# Patient Record
Sex: Male | Born: 1945 | Race: White | Hispanic: No | Marital: Married | Smoking: Never smoker
Health system: Southern US, Community
[De-identification: ages and names within clinical notes are randomized; demographics above are authoritative.]

## PROBLEM LIST (undated history)

## (undated) DIAGNOSIS — IMO0002 Reserved for concepts with insufficient information to code with codable children: Secondary | ICD-10-CM

## (undated) DIAGNOSIS — N189 Chronic kidney disease, unspecified: Secondary | ICD-10-CM

## (undated) DIAGNOSIS — IMO0001 Reserved for inherently not codable concepts without codable children: Secondary | ICD-10-CM

## (undated) DIAGNOSIS — C801 Malignant (primary) neoplasm, unspecified: Secondary | ICD-10-CM

## (undated) DIAGNOSIS — I10 Essential (primary) hypertension: Secondary | ICD-10-CM

## (undated) HISTORY — PX: HERNIA REPAIR: SHX51

## (undated) HISTORY — PX: OTHER SURGICAL HISTORY: SHX169

---

## 1999-12-23 ENCOUNTER — Ambulatory Visit (HOSPITAL_COMMUNITY): Admission: RE | Admit: 1999-12-23 | Discharge: 1999-12-23 | Payer: Self-pay | Admitting: Family Medicine

## 1999-12-23 ENCOUNTER — Encounter: Payer: Self-pay | Admitting: Family Medicine

## 2003-07-16 ENCOUNTER — Inpatient Hospital Stay (HOSPITAL_COMMUNITY): Admission: EM | Admit: 2003-07-16 | Discharge: 2003-07-19 | Payer: Self-pay | Admitting: Emergency Medicine

## 2003-07-16 ENCOUNTER — Encounter: Payer: Self-pay | Admitting: *Deleted

## 2005-07-23 ENCOUNTER — Encounter: Admission: RE | Admit: 2005-07-23 | Discharge: 2005-07-23 | Payer: Self-pay | Admitting: Family Medicine

## 2005-10-30 ENCOUNTER — Emergency Department (HOSPITAL_COMMUNITY): Admission: EM | Admit: 2005-10-30 | Discharge: 2005-10-30 | Payer: Self-pay | Admitting: Emergency Medicine

## 2007-05-11 ENCOUNTER — Ambulatory Visit: Admission: RE | Admit: 2007-05-11 | Discharge: 2007-09-05 | Payer: Self-pay | Admitting: Radiation Oncology

## 2007-08-06 ENCOUNTER — Ambulatory Visit: Admission: RE | Admit: 2007-08-06 | Discharge: 2007-09-05 | Payer: Self-pay | Admitting: Radiation Oncology

## 2008-04-07 ENCOUNTER — Emergency Department (HOSPITAL_COMMUNITY): Admission: EM | Admit: 2008-04-07 | Discharge: 2008-04-07 | Payer: Self-pay | Admitting: Emergency Medicine

## 2008-06-06 ENCOUNTER — Encounter: Payer: Self-pay | Admitting: Urology

## 2008-06-06 ENCOUNTER — Inpatient Hospital Stay (HOSPITAL_COMMUNITY): Admission: RE | Admit: 2008-06-06 | Discharge: 2008-06-08 | Payer: Self-pay | Admitting: Urology

## 2008-09-18 ENCOUNTER — Encounter: Admission: RE | Admit: 2008-09-18 | Discharge: 2008-10-09 | Payer: Self-pay | Admitting: Family Medicine

## 2009-04-27 ENCOUNTER — Ambulatory Visit (HOSPITAL_COMMUNITY): Admission: RE | Admit: 2009-04-27 | Discharge: 2009-04-28 | Payer: Self-pay | Admitting: General Surgery

## 2010-12-10 ENCOUNTER — Encounter
Admission: RE | Admit: 2010-12-10 | Discharge: 2010-12-10 | Payer: Self-pay | Source: Home / Self Care | Attending: Urology | Admitting: Urology

## 2011-03-18 LAB — LIPID PANEL
Cholesterol: 186 mg/dL (ref 0–200)
HDL: 58 mg/dL (ref >39)
Total CHOL/HDL Ratio: 3.2 RATIO
VLDL: 11 mg/dL (ref 0–40)

## 2011-03-18 LAB — CBC
HCT: 46 % (ref 39.0–52.0)
Hemoglobin: 15.7 g/dL (ref 13.0–17.0)
MCHC: 34.2 g/dL (ref 30.0–36.0)
RDW: 13.7 % (ref 11.5–15.5)

## 2011-03-18 LAB — TYPE AND SCREEN: ABO/RH(D): A NEG

## 2011-03-18 LAB — DIFFERENTIAL
Basophils Relative: 1 % (ref 0–1)
Monocytes Relative: 10 % (ref 3–12)
Neutro Abs: 3.7 10*3/uL (ref 1.7–7.7)
Neutrophils Relative %: 70 % (ref 43–77)

## 2011-03-18 LAB — COMPREHENSIVE METABOLIC PANEL
Alkaline Phosphatase: 50 U/L (ref 39–117)
BUN: 12 mg/dL (ref 6–23)
CO2: 28 mEq/L (ref 19–32)
Calcium: 9.7 mg/dL (ref 8.4–10.5)
GFR calc non Af Amer: >60 mL/min (ref >60)
Glucose, Bld: 105 mg/dL — ABNORMAL HIGH (ref 70–99)
Potassium: 5.1 mEq/L (ref 3.5–5.1)
Total Protein: 6.9 g/dL (ref 6.0–8.3)

## 2011-03-18 LAB — PSA: PSA: 1.01 ng/mL (ref 0.10–4.00)

## 2011-03-18 LAB — PROTIME-INR
INR: 1 (ref 0.00–1.49)
Prothrombin Time: 13.3 seconds (ref 11.6–15.2)

## 2011-04-22 NOTE — Op Note (Signed)
Sergio Ramos, DAY NO.:  [id number]   MEDICAL RECORD NO.:  [id number]          PATIENT TYPE:  INP   LOCATION:  0004                         FACILITY:  [facility]   PHYSICIAN:  Bertram Millard. Dahlstedt, M.D.DATE OF BIRTH:  [birth year]   DATE OF PROCEDURE:  06/06/2008  DATE OF DISCHARGE:                               OPERATIVE REPORT   PREOPERATIVE DIAGNOSES:  Renal tumor, status post partial nephrectomy,  with postoperative bleed.   POSTOPERATIVE DIAGNOSES:  Renal tumor, status post partial nephrectomy,  with postoperative bleed.   PRINCIPAL PROCEDURE:  Laparoscopic reexploration of postoperative bleed,  placement of hemostatic agents.   SURGEON:  Bertram Millard. Dahlstedt, M.D.   FIRST ASSISTANT:  Valetta Fuller, M.D.   ANESTHESIA:  General endotracheal.   COMPLICATIONS:  None.   DRAINS:  A 15 French Blake drain in right upper quadrant.   BRIEF HISTORY:  Sergio Ramos underwent a laparoscopic partial nephrectomy  several hours ago.  He had an uncomplicated procedure, and at the  termination of the procedure there was no bleeding of the tumor site.  Once he was awakened, and in the PACU, he developed a slow bleed, so  much that he eventually had 450 mL of blood in his drain.  He was  hemodynamically stable.  His preoperative hematocrit was approximately  45%.  Due to the steady nature of this bleed, despite him being stable,  I felt that it was most appropriate to proceed with exploration and  treatment of this bleed.  I discussed this situation with the patient  and his wife.  They agree with proceeding.   DESCRIPTION OF PROCEDURE:  The patient was administered general  anesthetic after proper identification was performed.  He was placed in  the right side up position on the table, with the table flexed.  All  extremities were padded appropriately and an axillary roll was placed.  His entire abdomen and right flank was prepped.  The drain was left in.  He was  prepped and draped.  His incisions were opened up, except for the  right upper quadrant incision where the drain was present.  A 12-mm  trocar was placed in the midline in the right lower quadrant through  previously placed incisions.  A 5-mm trocar was placed in the midline  upper abdomen.  Pneumoperitoneum was then established.  Inspection of  the right upper quadrant revealed a fair amount of clot above the liver  and above the kidney anteriorly.  There was also some medially and  lateral to the kidney.  Once the clot was removed, there did not seem to  be active bleeding over the partial nephrectomy site.  This was  inspected significantly under pneumoperitoneum and with the  pneumoperitoneum released.  Inspection was then carried out medially.  The hilar area seemed to be hemostatic.  There was an old clot there  that was removed.  Laterally and inferiorly, as well as posteriorly, no  bleeding was seen.  The peritoneal edge of the upper pole of the kidney  was identified.  There seemed to be some oozing coming from  this area.  No specific site was seen, however.  FloSeal was placed in this area.  It was felt that, after this was placed, most of the bleeding was  stopped.  Inspection of this area and of the right pericolic gutter was  performed.  After this, no further blood was seen pooling in this area.  The FloSeal was also placed in the lateral aspect of the dissected  kidney.  Surgicel was placed over top of this.  Inspection was carried  out for approximately 15 minutes after hemostatic agents were placed.  No significant bleeding was seen with either the pneumoperitoneum up or  with it released.  The trocar sites were all inspected, and no bleeding  was seen.  At this point, the pneumoperitoneum was released, trocars  were removed, and fascial sutures were placed in the lower abdominal  incisions using 0 Vicryl placed in simple interrupted fashion.  The skin  edges were  reapproximated using staples.  Dry sterile dressings were  placed.  Inspection of the drain after this revealed no significant  oozing.  The patient was awakened, extubated, and taken to the PACU in  stable condition.      Bertram Millard. Dahlstedt, M.D.  Electronically Signed     SMD/MEDQ  D:  06/06/2008  T:  06/06/2008  Job:  [id number]

## 2011-04-22 NOTE — Op Note (Signed)
NAMEEDUAR, KUMPF NO.:  [id number]   MEDICAL RECORD NO.:  [id number]          PATIENT TYPE:  INP   LOCATION:  0004                         FACILITY:  [facility]   PHYSICIAN:  Bertram Millard. Dahlstedt, M.D.DATE OF BIRTH:  11-25-1946   DATE OF PROCEDURE:  06/06/2008  DATE OF DISCHARGE:                               OPERATIVE REPORT   PREOPERATIVE DIAGNOSIS:  Right renal mass.   POSTOPERATIVE DIAGNOSIS:  Right renal mass.   PROCEDURE PERFORMED:  Right laparoscopic partial nephrectomy and  intraoperative ultrasound.   SURGEON:  Bertram Millard. Dahlstedt, M.D.   ASSISTANT:  Melina Schools, MD   ANESTHESIA:  General   INDICATIONS FOR PROCEDURE:  Mr. Rosenberger is a 65 year old male with a  history of prostate cancer, status post IMRT.  He was found to have a  renal cortical mass measuring 1.3 cm in the right lower pole.  This was  felt amenable to minimally invasive partial nephrectomy.   DESCRIPTION OF PROCEDURE:  The patient brought to the operating room, he  was identified by his armband.  An informed consent was verified and  intraoperative time-out was performed.  Perioperative antibiotics were  administered and sequential compression devices were employed.  The  correct patient, side, and site were all verified; and the specimen time-  out was performed.  After the successful induction of general  anesthesia, the patient was moved to the flank position with the left  side down.  He was prepped and draped in the usual fashion. Surgeons  were gowned and gloved.  A 2-cm infraumbilical incision was made and was  carried down to the aponeurosis of the external oblique which was  incised with cautery.  We then split the rectus.  We then bluntly gained  access to the retroperitoneum.  We placed stay sutures in the fascia.  We then introduced a Hasson port.  We then began insufflation with  carbon dioxide to establish pneumoperitoneum.  After pneumoperitoneum  was  established, we performed laparoscopy.  We noted the colon at the  hepatic flexure, and found the white line of Toldt.  We placed to  assistant 5 mm ports in the epigastrium, and a 12-mm port in the right  lower quadrant.  We then incised the white line of Toldt, and mobilized  the colon medially.  This was done with a combination of blunt and sharp  dissection.  It was mobilized off the anterior surface of the kidney.  We also encountered the duodenum which was kocherized away from the  kidney.   Once this dissection was complete, we continued to mobilize the colon  medially until we could gain access to the retroperitoneum.  We  identified the gonadal vein and the ureter.  We dissected posterior to  that and tented the kidney upwards.  We then began marching up along the  gonadal vessels, and we found where it inserted into the vena cava.  We  then identified with the area where we thought the renal hilum was.  Having identified the hilum, we then proceeded to attempt to identify  the tumor.  There was  a very large amount of fat on Gerota's, but we  were eventually able to mobilize the fat and identified the tumor which  was in the lower pole and somewhat laterally projecting.  Again this was  a very tedious dissection, as she had a large amount of fat.  Once this  was complete, we then skeletonized the vessels at the renal hilum.   The then prepared for the partial nephrectomy.  However, we did not feel  as if we had enough mobility, so we mobilized it laterally, superiorly,  and inferiorly.  We then used the intraoperative ultrasound to confirm  that the tumor was, indeed, in place and it was.  We then began  preparing for the partial nephrectomy.  We had a pre-tied bolster, made  of Surgicel, in place.  We scored the tumor and we gave 50 mg of  mannitol.  We then placed a bulldog across the renal vessels en block.  We then cored out the tumor with a good margin of normal parenchyma.   We  sent a separate deep margin for frozen section, and it returned negative  for tumor.  We then used Argon beam coagulator to fulgurate the bed.  We  placed the bolster within the bed.  We used a single Vicryl stitch to  secure the bolster in place.  This was secured with a combination of Hem-  o-lok clips and laperties.  We then placed FloSeal in the bed.  We then  removed the clamp and our hemostasis was excellent.  We placed a drain  through the lateral port.  We then removed the ports under direct  vision.  We then closed both 12 mm ports under direct vision from the  outside.  At this time, the procedure was terminated.  The patient  tolerated the procedure well.  Warm skin time was 34 minutes.  Bertram Millard. Dahlstedt, M.D. was the attending primary responsible physician and  was present and participated in the procedure.      Melina Schools, MD      Bertram Millard. Dahlstedt, M.D.  Electronically Signed    JR/MEDQ  D:  06/06/2008  T:  06/06/2008  Job:  [id number]

## 2011-04-22 NOTE — Op Note (Signed)
Sergio Ramos, Sergio Ramos NO.:  [id number]   MEDICAL RECORD NO.:  [id number]          PATIENT TYPE:  AMB   LOCATION:  DAY                          FACILITY:  [facility]   PHYSICIAN:  Adolph Pollack, M.D.DATE OF BIRTH:  1946/01/04   DATE OF PROCEDURE:  04/27/2009  DATE OF DISCHARGE:                               OPERATIVE REPORT   PREOPERATIVE DIAGNOSIS:  Ventral incisional hernia.   POSTOPERATIVE DIAGNOSIS:  Ventral incisional hernia.   PROCEDURE:  Laparoscopic ventral incisional hernia repair with mesh  (Parietex with nonadherent barrier).   SURGEON:  Adolph Pollack, M.D.   ANESTHESIA:  General.   INDICATIONS:  This 65 year old male underwent laparoscopic partial  nephrectomy for right renal cell carcinoma and had to have some re-  exploration for postoperative bleeding.  He has had a CT scan as well as  signs and symptoms of a periumbilical incisional hernia and now presents  for repair.  We discussed the procedure risks and aftercare  preoperatively.   TECHNIQUE:  He was seen in the holding area then brought to the  operating room and placed supine on the operating table and a general  anesthetic was administered.  A Foley catheter was inserted into the  bladder.  The abdominal wall hair was clipped and the abdominal wall was  sterilely prepped and draped.  In the left upper quadrant two  fingerbreadths below the costal margin local anesthetic consisting of  Marcaine was infiltrated superficially and deep.  A small incision was  made through the skin and subcutaneous tissue.  Using a 5 mm Optiview  trocar to gained access to the peritoneal cavity and pneumoperitoneum  was created.  Introduction of the laparoscope demonstrated no evidence  of injury to viscera or bleeding.   Following this I could visualize the hernia in the periumbilical region.  No adhesions or bowel was up in the hernia.  I placed a 5 mm trocar in  the left mid abdomen area.  I then  placed two 5 mm trocars on the right  side, one in the right upper quadrant and one in the right mid abdomen.   Using a spinal needle I marked the periphery of the hernia defect in  four quadrants and measured 4 cm away from this and drew a larger oval  on the skin.  I then brought a piece of Parietex mesh with nonadherent  barrier into the field.  The piece of mesh measured approximately 15 x  12 making the defect roughly 8 x 6 cm.   The mesh was cut to appropriate size and four anchoring stitches of #1  Novofil placed in the four quadrants.  Mesh was marked.  The mesh was  then rolled up.  I then replaced the right upper quadrant 5 mm trocar  with an 11 mm trocar.  The mesh was placed through the trocar into the  peritoneal cavity.  The mesh was then unrolled with the nonadherent  barrier side facing the viscera.   I then placed four stab wound incisions four quadrants around the  hernia.  I brought up the  anchoring sutures across the fascial bridge  and then tied these down initially anchoring the mesh to the anterior  abdominal wall with the rough side of the mesh facing the anterior  abdominal wall.  Using spiral tacks I then further anchored the mesh  with an outer layer of tacks and then an inner layer rim of tacks.  This  provided adequate coverage with good overlap of the defect.   Following this I inspected the area and noted no bleeding.  No evidence  of organ injury.  I then released the CO2 gas and then watched the mesh  approximate the omentum.  Trocars were removed.   All skin incisions were closed with 4-0 Monocryl subcuticular stitches.  Steri-Strips and sterile dressings were applied.   He tolerated the procedure well without any apparent complications and  was taken to recovery in satisfactory condition.      Adolph Pollack, M.D.  Electronically Signed     TJR/MEDQ  D:  04/27/2009  T:  04/27/2009  Job:  [id number]   cc:   Valetta Fuller, M.D.  Fax:  [phone]

## 2011-04-25 NOTE — Discharge Summary (Signed)
   NAME:  Sergio Ramos, Sergio Ramos                        ACCOUNT NO.:  [id number]   MEDICAL RECORD NO.:  [id number]                   PATIENT TYPE:  INP   LOCATION:  5031                                 FACILITY:  [facility]   PHYSICIAN:  Lowell Bouton, M.D.      DATE OF BIRTH:  11-04-1946   DATE OF ADMISSION:  07/16/2003  DATE OF DISCHARGE:  07/19/2003                                 DISCHARGE SUMMARY   FINAL DIAGNOSIS:  Flexor tendon sheath infection, right middle finger.   BRIEF HISTORY:  The patient is a 65 year old, right-handed male, who  lacerated his right middle finger on the throttle of [place] nine [place] prior to admission.  He put a Band-Aid over it and three  days prior to admission, it began to swell.  He was seen at [facility],  August 7, and was give Rocephin IV and Keflex p.o.  He did not improve and  presented with increasing pain and swelling.  On exam, he was found to have  tenderness along the flexor sheath with erythema and swelling.  There was  pain on past extension of the digit, and x-rays were negative.   HOSPITAL COURSE:  He was taken to the operating room, August 8, and  underwent incision and drainage of his flexor tendon sheath.  Postoperatively, he was kept on Unasyn IV.  Cultures remained negative  throughout his hospital course.  His wound was packed open and on August 10,  he underwent whirlpool treatment with the removal of his packing.  He  continued to improve and was ready for discharge on August 11.  He was  instructed to soak his finger in salt water three times a day and was given  prescriptions for Tylox and Augmentin.  He was instructed to follow up in  three days in the office.   CONDITION ON DISCHARGE:  Improved.   FINAL DIAGNOSIS:  Flexor tendon sheath infection, right middle finger.                                                Lowell Bouton, M.D.    EMM/MEDQ  D:  09/20/2003  T:  09/20/2003   Job:  [id number]

## 2011-04-25 NOTE — H&P (Signed)
NAME:  Sergio Ramos, Sergio Ramos NO.:  [id number]   MEDICAL RECORD NO.:  [id number]                   PATIENT TYPE:  EMS   LOCATION:  MAJO                                 FACILITY:  [facility]   PHYSICIAN:  Lowell Bouton, M.D.      DATE OF BIRTH:  10-22-1946   DATE OF ADMISSION:  07/16/2003  DATE OF DISCHARGE:                                HISTORY & PHYSICAL   CHIEF COMPLAINT:  Pain and swelling, right middle finger.   HISTORY OF PRESENT ILLNESS:  The patient is a 65 year old, right-handed male  who lacerated his right middle finger on the throttle of [place] 9 [place].  He treated it with a Band-Aid and continued to get  a lot of water in it.  He developed swelling 3 days ago and went to urgent  medical care yesterday.  He was given a gram of Rocephin IV and then placed  on Keflex p.o.  He did not see any improvement, and actually, the pain and  swelling has increased.  He presents today with significant pain and  swelling of his right middle finger.  He has not had any fevers at this  point.   PAST MEDICAL HISTORY:  Remarkable for kidney stones.  He has no history of  previous hospitalizations.   CURRENT MEDICATIONS:  Zocor.   ALLERGIES:  None.   FAMILY HISTORY:  Positive for heart disease.  His father died at the age of  64.   SOCIAL HISTORY:  He does not smoke, drinks occasionally, is married, and is  retired.   REVIEW OF SYSTEMS:  Negative for diabetes, hypertension, and heart disease.   PHYSICAL EXAMINATION:  GENERAL:  The patient is a well-developed, well-  nourished male in mild distress with a swollen right middle finger.  HEENT:  Pupils are equal, round, and reactive to light.  Nasal septum is  midline.  Oropharynx is clear.  External auditory canals are clear.  NECK:  No adenopathy or bruit.  CHEST:  Clear.  HEART:  Regular without a murmur.  ABDOMEN:  Soft, nontender without masses.  EXTREMITIES:   Normal with the exception of the right hand.  His right middle  finger reveals a healing laceration over the radial aspect of the proximal  phalanx with swelling and erythema along the entire flexor tendon sheath  from the proximal digital crease distally.  It is quite tender along the  flexor sheath and there is pain on past extension.  The palm is not  particularly tender at the A1 pulley.  Sensation is present, but diminished.   X-rays reveal no foreign body.   DIAGNOSIS:  Septic flexor tenosynovitis, right middle finger.    PLAN:  The patient will be taken to the operating room today for incision,  drainage, and cultures of his infection.  He will be admitted  postoperatively for IV antibiotics and a consultation will be obtained from  the infectious disease department for antibiotic recommendations.                                                  Lowell Bouton, M.D.    EMM/MEDQ  D:  07/16/2003  T:  07/16/2003  Job:  [id number]   cc:   Donia Guiles, M.D.  [address] [place]  [place] [address]  Fax: [phone]

## 2011-04-25 NOTE — Op Note (Signed)
   NAME:  Sergio Ramos, Sergio Ramos                        ACCOUNT NO.:  [id number]   MEDICAL RECORD NO.:  [id number]                   PATIENT TYPE:  INP   LOCATION:  1826                                 FACILITY:  [facility]   PHYSICIAN:  Lowell Bouton, M.D.      DATE OF BIRTH:  1946/10/23   DATE OF PROCEDURE:  DATE OF DISCHARGE:                                 OPERATIVE REPORT   PREOPERATIVE DIAGNOSIS:  Flexor tendon sheath infection, right middle  finger.   POSTOPERATIVE DIAGNOSIS:  Flexor tendon sheath infection, right middle  finger.   PROCEDURE:  Incision and drainage of flexor sheath, right middle finger.   SURGEON:  Lowell Bouton, M.D.   ANESTHESIA:  General with 0.5% Marcaine digital block.   OPERATIVE FINDINGS:  The patient had purulent material in the flexor sheath.  The tendons appeared to be intact without any shredding.   DESCRIPTION OF PROCEDURE:  Under general anesthesia with the tourniquet on  the right arm, the right hand was prepped and draped in the usual fashion  and after elevating the limb, the tourniquet was inflated to 250 mmHg.  A  zigzag incision was made across the PIP crease volarly, and sharp dissection  carried through the subcutaneous tissue. Blunt dissection was carried down  to the flexor tendon sheath, and cultures were obtained from the purulent  material.  A second incision was made in the palm in line with the distal  palmar crease, and blunt dissection carried down to the tendon sheath.  A 16-  gauge Angiocath was inserted in the flexor sheath proximally, and the sheath  was irrigated out from proximal to distal using antibiotic solution followed  by saline.  After irrigating the sheath completely, the wounds were packed  with Iodoform packing and loosely closed with 4-0 nylon suture.  Sterile  dressings were applied. Half percent Marcaine digital block was inserted for  pain control.  The patient was placed in a dorsal splint.  He  tolerated the  procedure well and went to the recovery room awake and stable in good  condition.                                               Lowell Bouton, M.D.    EMM/MEDQ  D:  07/16/2003  T:  07/16/2003  Job:  [id number]   cc:   Donia Guiles, M.D.  [address] [place]  [place] [address]  Fax: [phone]

## 2011-05-21 ENCOUNTER — Ambulatory Visit (HOSPITAL_COMMUNITY): Payer: PRIVATE HEALTH INSURANCE | Attending: Family Medicine | Admitting: Radiology

## 2011-05-21 VITALS — Ht 68.0 in | Wt 181.0 lb

## 2011-05-21 DIAGNOSIS — R0602 Shortness of breath: Secondary | ICD-10-CM

## 2011-05-21 DIAGNOSIS — Z0389 Encounter for observation for other suspected diseases and conditions ruled out: Secondary | ICD-10-CM

## 2011-05-21 DIAGNOSIS — E7800 Pure hypercholesterolemia, unspecified: Secondary | ICD-10-CM | POA: Insufficient documentation

## 2011-05-21 MED ORDER — TECHNETIUM TC 99M TETROFOSMIN IV KIT
33.0000 | PACK | Freq: Once | INTRAVENOUS | Status: AC | PRN
  Administered 2011-05-21: 33 via INTRAVENOUS

## 2011-05-21 MED ORDER — TECHNETIUM TC 99M TETROFOSMIN IV KIT
10.4000 | PACK | Freq: Once | INTRAVENOUS | Status: AC | PRN
  Administered 2011-05-21: 10 via INTRAVENOUS

## 2011-05-21 NOTE — Progress Notes (Signed)
[facility] SITE 3 NUCLEAR MED [address] [place] [address] [phone]  Cardiology Nuclear Med Study  Sergio Ramos is a 65 y.o. male [id number] [birth year]   Nuclear Med Background Indication for Stress Test:  Evaluation for Ischemia History:2007  Myocardial Perfusion Study Cardiac Risk Factors: Family History - CAD and Lipids  Symptoms:  Palpitations   Nuclear Pre-Procedure Caffeine/Decaff Intake:  None NPO After: 11:00pm   Lungs:  clear IV 0.9% NS with Angio Cath:  20g  IV Site: R Antecubital  IV Started by:  Irean Hong, RN  Chest Size (in):  42 Cup Size: n/a  Height: 5\' 8"  (1.727 m)  Weight:  181 lb (82.101 kg)  BMI:  Body mass index is 27.52 kg/(m^2). Tech Comments:  N/A    Nuclear Med Study 1 or 2 day study: 1 day  Stress Test Type:  Stress  Reading MD: Marca Ancona, MD  Order Authorizing Provider:  Maurice Small  Resting Radionuclide: Technetium 67m Tetrofosmin  Resting Radionuclide Dose: 10.4 mCi   Stress Radionuclide:  Technetium 54m Tetrofosmin  Stress Radionuclide Dose: 33.0 mCi           Stress Protocol Rest HR: 57 Stress HR: 150  Rest BP: 140/90 Stress BP: 182/90  Exercise Time (min): 11:31 min METS: 13.70          Dose of Adenosine (mg):  n/a Dose of Lexiscan: n/a mg  Dose of Atropine (mg): n/a Dose of Dobutamine: n/a mcg/kg/min (at max HR)  Stress Test Technologist: Frederick Peers, EMT-P  Nuclear Technologist:  Domenic Polite, CNMT     Rest Procedure:  Myocardial perfusion imaging was performed at rest 45 minutes following the intravenous administration of Technetium 23m Tetrofosmin. Rest ECG: NSR  Stress Procedure:  The patient exercised for 11:59min.  The patient stopped due to SOB/fatigue and denied any chest pain.  There were non specific ST-T wave changes.  Technetium 37m Tetrofosmin was injected at peak exercise and myocardial perfusion imaging was performed after a brief delay. Stress ECG: No significant  change from baseline ECG  QPS Raw Data Images:  Normal; no motion artifact; normal heart/lung ratio. Stress Images:  Normal homogeneous uptake in all areas of the myocardium. Rest Images:  Normal homogeneous uptake in all areas of the myocardium. Subtraction (SDS):  There is no evidence of scar or ischemia. Transient Ischemic Dilatation (Normal <1.22):  0.92 Lung/Heart Ratio (Normal <0.45):  0.21  Quantitative Gated Spect Images QGS EDV:  88 ml QGS ESV:  31 ml QGS cine images:  Normal Wall Motion QGS EF: 65%  Impression Exercise Capacity:  Good exercise capacity. BP Response:  Normal blood pressure response. Clinical Symptoms:  Short of breath, no chest pain.  ECG Impression:  No significant ST segment change suggestive of ischemia. Comparison with Prior Nuclear Study: No significant change from previous study  Overall Impression:  Normal stress nuclear study.  Adonai Helzer [employer]

## 2011-05-22 NOTE — Progress Notes (Signed)
Copy faxed to Dr. Valentina Lucks.Sergio Ramos

## 2011-09-04 LAB — BASIC METABOLIC PANEL
BUN: 16
CO2: 32
CO2: 27
Calcium: 9.7
Calcium: 8 — ABNORMAL LOW
Chloride: 103
Chloride: 102
Creatinine, Ser: 1.09
Creatinine, Ser: 1.2
GFR calc Af Amer: >60
Glucose, Bld: 110 — ABNORMAL HIGH
Glucose, Bld: 184 — ABNORMAL HIGH

## 2011-09-04 LAB — CBC
Hemoglobin: 12 — ABNORMAL LOW
Hemoglobin: 11.3 — ABNORMAL LOW
Hemoglobin: 12.1 — ABNORMAL LOW
MCHC: 34.8
MCHC: 34.1
MCHC: 34.3
MCV: 89.4
MCV: 89.9
RBC: 3.86 — ABNORMAL LOW
RBC: 3.66 — ABNORMAL LOW
RDW: 13.2
RDW: 13.4
WBC: 11 — ABNORMAL HIGH

## 2011-09-04 LAB — TYPE AND SCREEN
ABO/RH(D): A NEG
Antibody Screen: NEGATIVE

## 2011-09-04 LAB — DIFFERENTIAL
Basophils Absolute: 0
Basophils Relative: 0
Eosinophils Absolute: 0
Monocytes Absolute: 0.4
Monocytes Relative: 5
Neutro Abs: 6.3
Neutrophils Relative %: 90 — ABNORMAL HIGH

## 2015-01-03 ENCOUNTER — Encounter: Payer: Self-pay | Admitting: Cardiovascular Disease

## 2015-02-06 ENCOUNTER — Other Ambulatory Visit: Payer: Self-pay | Admitting: Urology

## 2015-02-08 ENCOUNTER — Other Ambulatory Visit (HOSPITAL_COMMUNITY): Payer: Self-pay | Admitting: Urology

## 2015-02-08 DIAGNOSIS — R160 Hepatomegaly, not elsewhere classified: Secondary | ICD-10-CM

## 2015-02-08 DIAGNOSIS — C61 Malignant neoplasm of prostate: Secondary | ICD-10-CM

## 2015-02-20 ENCOUNTER — Ambulatory Visit (HOSPITAL_COMMUNITY)
Admission: RE | Admit: 2015-02-20 | Discharge: 2015-02-20 | Disposition: A | Discharge status: Home / Self Care | Payer: Medicare Other | Source: Ambulatory Visit | Attending: Urology | Admitting: Urology

## 2015-02-20 DIAGNOSIS — R160 Hepatomegaly, not elsewhere classified: Secondary | ICD-10-CM | POA: Diagnosis present

## 2015-02-20 DIAGNOSIS — C61 Malignant neoplasm of prostate: Secondary | ICD-10-CM | POA: Insufficient documentation

## 2015-03-23 NOTE — Patient Instructions (Addendum)
Sergio Ramos  03/23/2015   Your procedure is scheduled on:   03/28/15    Report to [facility] Main  Entrance and follow signs to               [place] at    1045 AM.  Call this number if you have problems the morning of surgery [phone]   Remember:  Do not eat food or drink liquids :After Midnight.     Take these medicines the morning of surgery with A SIP OF WATER: none                               You may not have any metal on your body including hair pins and              piercings  Do not wear jewelry, make-up, lotions, powders or perfumes., deodorant.                           Men may shave face and neck.   Do not bring valuables to the hospital. [place].  Contacts, dentures or bridgework may not be worn into surgery.  Leave suitcase in the car. After surgery it may be brought to your room.         Special Instructions: coughing and deep breathing exercises, leg exercises               Please read over the following fact sheets you were given: _____________________________________________________________________             [facility] - Preparing for Surgery Before surgery, you can play an important role.  Because skin is not sterile, your skin needs to be as free of germs as possible.  You can reduce the number of germs on your skin by washing with CHG (chlorahexidine gluconate) soap before surgery.  CHG is an antiseptic cleaner which kills germs and bonds with the skin to continue killing germs even after washing. Please DO NOT use if you have an allergy to CHG or antibacterial soaps.  If your skin becomes reddened/irritated stop using the CHG and inform your nurse when you arrive at Short Stay. Do not shave (including legs and underarms) for at least 48 hours prior to the first CHG shower.  You may shave your face/neck. Please follow these instructions carefully:  1.  Shower  with CHG Soap the night before surgery and the  morning of Surgery.  2.  If you choose to wash your hair, wash your hair first as usual with your  normal  shampoo.  3.  After you shampoo, rinse your hair and body thoroughly to remove the  shampoo.                           4.  Use CHG as you would any other liquid soap.  You can apply chg directly  to the skin and wash                       Gently with a scrungie or clean washcloth.  5.  Apply the CHG Soap to your body ONLY FROM  THE NECK DOWN.   Do not use on face/ open                           Wound or open sores. Avoid contact with eyes, ears mouth and genitals (private parts).                       Wash face,  Genitals (private parts) with your normal soap.             6.  Wash thoroughly, paying special attention to the area where your surgery  will be performed.  7.  Thoroughly rinse your body with warm water from the neck down.  8.  DO NOT shower/wash with your normal soap after using and rinsing off  the CHG Soap.                9.  Pat yourself dry with a clean towel.            10.  Wear clean pajamas.            11.  Place clean sheets on your bed the night of your first shower and do not  sleep with pets. Day of Surgery : Do not apply any lotions/deodorants the morning of surgery.  Please wear clean clothes to the hospital/surgery center.  FAILURE TO FOLLOW THESE INSTRUCTIONS MAY RESULT IN THE CANCELLATION OF YOUR SURGERY PATIENT SIGNATURE_________________________________  NURSE SIGNATURE__________________________________  ________________________________________________________________________  WHAT IS A BLOOD TRANSFUSION? Blood Transfusion Information  A transfusion is the replacement of blood or some of its parts. Blood is made up of multiple cells which provide different functions.  Red blood cells carry oxygen and are used for blood loss replacement.  White blood cells fight against infection.  Platelets control  bleeding.  Plasma helps clot blood.  Other blood products are available for specialized needs, such as hemophilia or other clotting disorders. BEFORE THE TRANSFUSION  Who gives blood for transfusions?   Healthy volunteers who are fully evaluated to make sure their blood is safe. This is blood bank blood. Transfusion therapy is the safest it has ever been in the practice of medicine. Before blood is taken from a donor, a complete history is taken to make sure that person has no history of diseases nor engages in risky social behavior (examples are intravenous drug use or sexual activity with multiple partners). The donor's travel history is screened to minimize risk of transmitting infections, such as malaria. The donated blood is tested for signs of infectious diseases, such as HIV and hepatitis. The blood is then tested to be sure it is compatible with you in order to minimize the chance of a transfusion reaction. If you or a relative donates blood, this is often done in anticipation of surgery and is not appropriate for emergency situations. It takes many days to process the donated blood. RISKS AND COMPLICATIONS Although transfusion therapy is very safe and saves many lives, the main dangers of transfusion include:   Getting an infectious disease.  Developing a transfusion reaction. This is an allergic reaction to something in the blood you were given. Every precaution is taken to prevent this. The decision to have a blood transfusion has been considered carefully by your caregiver before blood is given. Blood is not given unless the benefits outweigh the risks. AFTER THE TRANSFUSION  Right after receiving a blood transfusion, you will usually feel much better and  more energetic. This is especially true if your red blood cells have gotten low (anemic). The transfusion raises the level of the red blood cells which carry oxygen, and this usually causes an energy increase.  The nurse  administering the transfusion will monitor you carefully for complications. HOME CARE INSTRUCTIONS  No special instructions are needed after a transfusion. You may find your energy is better. Speak with your caregiver about any limitations on activity for underlying diseases you may have. SEEK MEDICAL CARE IF:   Your condition is not improving after your transfusion.  You develop redness or irritation at the intravenous (IV) site. SEEK IMMEDIATE MEDICAL CARE IF:  Any of the following symptoms occur over the next 12 hours:  Shaking chills.  You have a temperature by mouth above 102 F (38.9 C), not controlled by medicine.  Chest, back, or muscle pain.  People around you feel you are not acting correctly or are confused.  Shortness of breath or difficulty breathing.  Dizziness and fainting.  You get a rash or develop hives.  You have a decrease in urine output.  Your urine turns a dark color or changes to pink, red, or brown. Any of the following symptoms occur over the next 10 days:  You have a temperature by mouth above 102 F (38.9 C), not controlled by medicine.  Shortness of breath.  Weakness after normal activity.  The white part of the eye turns yellow (jaundice).  You have a decrease in the amount of urine or are urinating less often.  Your urine turns a dark color or changes to pink, red, or brown. Document Released: 11/21/2000 Document Revised: 02/16/2012 Document Reviewed: 07/10/2008 [facility] Patient Information 2014 [place], [place].  _______________________________________________________________________

## 2015-03-26 ENCOUNTER — Encounter (HOSPITAL_COMMUNITY): Payer: Self-pay

## 2015-03-26 ENCOUNTER — Encounter (HOSPITAL_COMMUNITY)
Admission: RE | Admit: 2015-03-26 | Discharge: 2015-03-26 | Disposition: A | Discharge status: Home / Self Care | Payer: Medicare Other | Source: Ambulatory Visit | Attending: Urology | Admitting: Urology

## 2015-03-26 HISTORY — DX: Reserved for inherently not codable concepts without codable children: IMO0001

## 2015-03-26 HISTORY — DX: Reserved for concepts with insufficient information to code with codable children: IMO0002

## 2015-03-26 HISTORY — DX: Malignant (primary) neoplasm, unspecified: C80.1

## 2015-03-26 HISTORY — DX: Chronic kidney disease, unspecified: N18.9

## 2015-03-26 HISTORY — DX: Essential (primary) hypertension: I10

## 2015-03-26 LAB — BASIC METABOLIC PANEL
Anion gap: 12 (ref 5–15)
BUN: 17 mg/dL (ref 6–23)
CHLORIDE: 103 mmol/L (ref 96–112)
CO2: 26 mmol/L (ref 19–32)
Calcium: 9.7 mg/dL (ref 8.4–10.5)
Creatinine, Ser: 1.12 mg/dL (ref 0.50–1.35)
GFR, EST AFRICAN AMERICAN: 76 mL/min — AB (ref >90)
GFR, EST NON AFRICAN AMERICAN: 66 mL/min — AB (ref >90)
Glucose, Bld: 96 mg/dL (ref 70–99)
POTASSIUM: 4.3 mmol/L (ref 3.5–5.1)
SODIUM: 141 mmol/L (ref 135–145)

## 2015-03-26 LAB — CBC
HCT: 46.2 % (ref 39.0–52.0)
HEMOGLOBIN: 15.5 g/dL (ref 13.0–17.0)
MCH: 31.1 pg (ref 26.0–34.0)
MCHC: 33.5 g/dL (ref 30.0–36.0)
MCV: 92.6 fL (ref 78.0–100.0)
Platelets: 216 10*3/uL (ref 150–400)
RBC: 4.99 MIL/uL (ref 4.22–5.81)
RDW: 13.5 % (ref 11.5–15.5)
WBC: 5.5 10*3/uL (ref 4.0–10.5)

## 2015-03-26 NOTE — Progress Notes (Signed)
Called patient and made him aware that [facility] did not call back regarding order for PSA ( if PSA could be done Preop).

## 2015-03-26 NOTE — Progress Notes (Signed)
Anderson Malta from office of Dr Tresa Moore called and stated PSA lab was not due yet.  I asked her if she should call patient and let him be aware of this. ,

## 2015-03-26 NOTE — Progress Notes (Signed)
Patient requested at preop appt if PSA could be drawn since elevated at last tw times labs have been drawn and with history of prostate cancer.  [facility] and left message for nurse of Dr Tresa Moore if PSA could be drawn at preop appointment.  Nurse to call back.

## 2015-03-26 NOTE — Progress Notes (Signed)
Patient reports at time of preop appointment " past history of left upper arm muscle blowout".

## 2015-03-26 NOTE — Progress Notes (Signed)
Ct of chest, abdomen and pelvis with contrast done 01/18/2015 on chart.

## 2015-03-27 NOTE — Progress Notes (Signed)
Final EKG in EPIC done 03/26/15.

## 2015-03-28 ENCOUNTER — Encounter (HOSPITAL_COMMUNITY): Payer: Self-pay | Admitting: *Deleted

## 2015-03-28 ENCOUNTER — Inpatient Hospital Stay (HOSPITAL_COMMUNITY)
Admission: RE | Admit: 2015-03-28 | Discharge: 2015-03-30 | DRG: 658 | Disposition: A | Discharge status: Home / Self Care | Payer: Medicare Other | Source: Ambulatory Visit | Attending: Urology | Admitting: Urology

## 2015-03-28 ENCOUNTER — Inpatient Hospital Stay (HOSPITAL_COMMUNITY): Payer: Medicare Other | Admitting: Certified Registered Nurse Anesthetist

## 2015-03-28 ENCOUNTER — Encounter (HOSPITAL_COMMUNITY)
Admission: RE | Disposition: A | Discharge status: Home / Self Care | Payer: Self-pay | Source: Ambulatory Visit | Attending: Urology

## 2015-03-28 DIAGNOSIS — Z905 Acquired absence of kidney: Secondary | ICD-10-CM | POA: Diagnosis present

## 2015-03-28 DIAGNOSIS — Z923 Personal history of irradiation: Secondary | ICD-10-CM

## 2015-03-28 DIAGNOSIS — C642 Malignant neoplasm of left kidney, except renal pelvis: Secondary | ICD-10-CM | POA: Diagnosis present

## 2015-03-28 DIAGNOSIS — N200 Calculus of kidney: Secondary | ICD-10-CM | POA: Diagnosis present

## 2015-03-28 DIAGNOSIS — N281 Cyst of kidney, acquired: Secondary | ICD-10-CM | POA: Diagnosis present

## 2015-03-28 DIAGNOSIS — N2889 Other specified disorders of kidney and ureter: Secondary | ICD-10-CM | POA: Diagnosis present

## 2015-03-28 DIAGNOSIS — Z8546 Personal history of malignant neoplasm of prostate: Secondary | ICD-10-CM | POA: Diagnosis not present

## 2015-03-28 DIAGNOSIS — I129 Hypertensive chronic kidney disease with stage 1 through stage 4 chronic kidney disease, or unspecified chronic kidney disease: Secondary | ICD-10-CM | POA: Diagnosis present

## 2015-03-28 DIAGNOSIS — Z8553 Personal history of malignant neoplasm of renal pelvis: Secondary | ICD-10-CM

## 2015-03-28 DIAGNOSIS — N189 Chronic kidney disease, unspecified: Secondary | ICD-10-CM | POA: Diagnosis present

## 2015-03-28 HISTORY — PX: ROBOTIC ASSITED PARTIAL NEPHRECTOMY: SHX6087

## 2015-03-28 LAB — HEMOGLOBIN AND HEMATOCRIT, BLOOD
HCT: 43.2 % (ref 39.0–52.0)
Hemoglobin: 14.7 g/dL (ref 13.0–17.0)

## 2015-03-28 SURGERY — ROBOTIC ASSITED PARTIAL NEPHRECTOMY
Anesthesia: General | Laterality: Left

## 2015-03-28 MED ORDER — HYDROMORPHONE HCL 1 MG/ML IJ SOLN
0.2500 mg | INTRAMUSCULAR | Status: DC | PRN
  Administered 2015-03-28 (×5): 0.5 mg via INTRAVENOUS

## 2015-03-28 MED ORDER — STERILE WATER FOR IRRIGATION IR SOLN
Status: DC | PRN
  Administered 2015-03-28: 3000 mL

## 2015-03-28 MED ORDER — CISATRACURIUM BESYLATE (PF) 10 MG/5ML IV SOLN
INTRAVENOUS | Status: DC | PRN
  Administered 2015-03-28: 6 mg via INTRAVENOUS
  Administered 2015-03-28: 2 mg via INTRAVENOUS
  Administered 2015-03-28: 8 mg via INTRAVENOUS
  Administered 2015-03-28 (×3): 2 mg via INTRAVENOUS

## 2015-03-28 MED ORDER — HYDROMORPHONE HCL 2 MG/ML IJ SOLN
INTRAMUSCULAR | Status: AC
  Filled 2015-03-28: qty 1

## 2015-03-28 MED ORDER — MANNITOL 25 % IV SOLN
25.0000 g | Freq: Once | INTRAVENOUS | Status: AC
  Administered 2015-03-28 (×2): 12.5 g via INTRAVENOUS
  Filled 2015-03-28: qty 100

## 2015-03-28 MED ORDER — DEXTROSE-NACL 5-0.45 % IV SOLN
INTRAVENOUS | Status: DC
  Administered 2015-03-28 – 2015-03-29 (×3): via INTRAVENOUS

## 2015-03-28 MED ORDER — HYDROCHLOROTHIAZIDE 25 MG PO TABS
25.0000 mg | ORAL_TABLET | Freq: Every morning | ORAL | Status: DC
  Administered 2015-03-29 – 2015-03-30 (×2): 25 mg via ORAL
  Filled 2015-03-28 (×2): qty 1

## 2015-03-28 MED ORDER — METOCLOPRAMIDE HCL 5 MG/ML IJ SOLN
INTRAMUSCULAR | Status: AC
  Filled 2015-03-28: qty 2

## 2015-03-28 MED ORDER — NEOSTIGMINE METHYLSULFATE 10 MG/10ML IV SOLN
INTRAVENOUS | Status: DC | PRN
  Administered 2015-03-28: 5 mg via INTRAVENOUS

## 2015-03-28 MED ORDER — CISATRACURIUM BESYLATE 20 MG/10ML IV SOLN
INTRAVENOUS | Status: AC
  Filled 2015-03-28: qty 10

## 2015-03-28 MED ORDER — HYDROMORPHONE HCL 1 MG/ML IJ SOLN
0.5000 mg | INTRAMUSCULAR | Status: DC | PRN
  Administered 2015-03-28: 0.5 mg via INTRAVENOUS
  Administered 2015-03-29 – 2015-03-30 (×2): 1 mg via INTRAVENOUS
  Filled 2015-03-28 (×3): qty 1

## 2015-03-28 MED ORDER — BUPIVACAINE LIPOSOME 1.3 % IJ SUSP
INTRAMUSCULAR | Status: DC | PRN
Start: 2015-03-28 — End: 2015-03-28
  Administered 2015-03-28: 40 mL

## 2015-03-28 MED ORDER — PHENYLEPHRINE 40 MCG/ML (10ML) SYRINGE FOR IV PUSH (FOR BLOOD PRESSURE SUPPORT)
PREFILLED_SYRINGE | INTRAVENOUS | Status: AC
  Filled 2015-03-28: qty 10

## 2015-03-28 MED ORDER — HYDROMORPHONE HCL 1 MG/ML IJ SOLN
INTRAMUSCULAR | Status: AC
  Filled 2015-03-28: qty 1

## 2015-03-28 MED ORDER — PROPOFOL 10 MG/ML IV BOLUS
INTRAVENOUS | Status: AC
  Filled 2015-03-28: qty 20

## 2015-03-28 MED ORDER — SODIUM CHLORIDE 0.9 % IJ SOLN
INTRAMUSCULAR | Status: DC | PRN
  Administered 2015-03-28: 20 mL

## 2015-03-28 MED ORDER — LIDOCAINE HCL (CARDIAC) 20 MG/ML IV SOLN
INTRAVENOUS | Status: AC
  Filled 2015-03-28: qty 5

## 2015-03-28 MED ORDER — SODIUM CHLORIDE 0.9 % IJ SOLN
INTRAMUSCULAR | Status: AC
  Filled 2015-03-28: qty 30

## 2015-03-28 MED ORDER — OXYCODONE HCL 5 MG PO TABS
5.0000 mg | ORAL_TABLET | ORAL | Status: DC | PRN
  Administered 2015-03-28 – 2015-03-30 (×5): 5 mg via ORAL
  Filled 2015-03-28 (×5): qty 1

## 2015-03-28 MED ORDER — LIDOCAINE HCL (CARDIAC) 20 MG/ML IV SOLN
INTRAVENOUS | Status: DC | PRN
  Administered 2015-03-28: 100 mg via INTRAVENOUS

## 2015-03-28 MED ORDER — GLYCOPYRROLATE 0.2 MG/ML IJ SOLN
INTRAMUSCULAR | Status: DC | PRN
Start: 2015-03-28 — End: 2015-03-28
  Administered 2015-03-28: .6 mg via INTRAVENOUS

## 2015-03-28 MED ORDER — SIMVASTATIN 40 MG PO TABS
40.0000 mg | ORAL_TABLET | Freq: Every day | ORAL | Status: DC
  Administered 2015-03-28 – 2015-03-29 (×2): 40 mg via ORAL
  Filled 2015-03-28 (×4): qty 1

## 2015-03-28 MED ORDER — ONDANSETRON HCL 4 MG/2ML IJ SOLN
INTRAMUSCULAR | Status: AC
  Filled 2015-03-28: qty 2

## 2015-03-28 MED ORDER — HYDROCODONE-ACETAMINOPHEN 5-325 MG PO TABS: 1.0000 | ORAL_TABLET | Freq: Four times a day (QID) | ORAL | Status: DC | PRN

## 2015-03-28 MED ORDER — FENTANYL CITRATE (PF) 250 MCG/5ML IJ SOLN
INTRAMUSCULAR | Status: AC
  Filled 2015-03-28: qty 5

## 2015-03-28 MED ORDER — HYDROMORPHONE HCL 1 MG/ML IJ SOLN
INTRAMUSCULAR | Status: DC | PRN
  Administered 2015-03-28: 1 mg via INTRAVENOUS
  Administered 2015-03-28 (×2): 0.5 mg via INTRAVENOUS

## 2015-03-28 MED ORDER — BUPIVACAINE LIPOSOME 1.3 % IJ SUSP
20.0000 mL | Freq: Once | INTRAMUSCULAR | Status: DC
  Filled 2015-03-28: qty 20

## 2015-03-28 MED ORDER — HYDROMORPHONE HCL 1 MG/ML IJ SOLN: 0.2500 mg | INTRAMUSCULAR | Status: DC | PRN

## 2015-03-28 MED ORDER — LACTATED RINGERS IV SOLN
INTRAVENOUS | Status: DC
  Administered 2015-03-28 (×2): 1000 mL via INTRAVENOUS

## 2015-03-28 MED ORDER — NEOSTIGMINE METHYLSULFATE 10 MG/10ML IV SOLN
INTRAVENOUS | Status: AC
  Filled 2015-03-28: qty 1

## 2015-03-28 MED ORDER — PROPOFOL 10 MG/ML IV BOLUS
INTRAVENOUS | Status: DC | PRN
  Administered 2015-03-28: 180 mg via INTRAVENOUS

## 2015-03-28 MED ORDER — SUCCINYLCHOLINE CHLORIDE 20 MG/ML IJ SOLN
INTRAMUSCULAR | Status: DC | PRN
  Administered 2015-03-28: 100 mg via INTRAVENOUS

## 2015-03-28 MED ORDER — ONDANSETRON HCL 4 MG/2ML IJ SOLN
INTRAMUSCULAR | Status: DC | PRN
  Administered 2015-03-28: 4 mg via INTRAVENOUS

## 2015-03-28 MED ORDER — LACTATED RINGERS IR SOLN
Status: DC | PRN
  Administered 2015-03-28: 1000 mL

## 2015-03-28 MED ORDER — ACETAMINOPHEN 500 MG PO TABS
1000.0000 mg | ORAL_TABLET | Freq: Four times a day (QID) | ORAL | Status: AC
  Administered 2015-03-28 – 2015-03-29 (×4): 1000 mg via ORAL
  Filled 2015-03-28 (×5): qty 2

## 2015-03-28 MED ORDER — CEFAZOLIN SODIUM-DEXTROSE 2-3 GM-% IV SOLR
2.0000 g | INTRAVENOUS | Status: AC
  Administered 2015-03-28: 2 g via INTRAVENOUS

## 2015-03-28 MED ORDER — GLYCOPYRROLATE 0.2 MG/ML IJ SOLN
INTRAMUSCULAR | Status: AC
  Filled 2015-03-28: qty 3

## 2015-03-28 MED ORDER — MIDAZOLAM HCL 2 MG/2ML IJ SOLN
INTRAMUSCULAR | Status: AC
  Filled 2015-03-28: qty 2

## 2015-03-28 MED ORDER — METOCLOPRAMIDE HCL 5 MG/ML IJ SOLN
INTRAMUSCULAR | Status: DC | PRN
  Administered 2015-03-28: 10 mg via INTRAVENOUS

## 2015-03-28 MED ORDER — FENTANYL CITRATE (PF) 100 MCG/2ML IJ SOLN
INTRAMUSCULAR | Status: DC | PRN
  Administered 2015-03-28: 100 ug via INTRAVENOUS
  Administered 2015-03-28 (×3): 50 ug via INTRAVENOUS

## 2015-03-28 MED ORDER — PHENYLEPHRINE HCL 10 MG/ML IJ SOLN
INTRAMUSCULAR | Status: DC | PRN
  Administered 2015-03-28: 120 ug via INTRAVENOUS
  Administered 2015-03-28: 80 ug via INTRAVENOUS

## 2015-03-28 MED ORDER — CEFAZOLIN SODIUM-DEXTROSE 2-3 GM-% IV SOLR
INTRAVENOUS | Status: AC
  Filled 2015-03-28: qty 50

## 2015-03-28 MED ORDER — ALBUTEROL SULFATE HFA 108 (90 BASE) MCG/ACT IN AERS
INHALATION_SPRAY | RESPIRATORY_TRACT | Status: AC
  Filled 2015-03-28: qty 6.7

## 2015-03-28 MED ORDER — MIDAZOLAM HCL 5 MG/5ML IJ SOLN
INTRAMUSCULAR | Status: DC | PRN
  Administered 2015-03-28: 2 mg via INTRAVENOUS

## 2015-03-28 SURGICAL SUPPLY — 69 items
APL ESCP 34 STRL LF DISP (HEMOSTASIS) ×1
APPLICATOR SURGIFLO ENDO (HEMOSTASIS) ×3 IMPLANT
BAG SPEC RTRVL LRG 6X4 10 (ENDOMECHANICALS) ×1
CABLE HIGH FREQUENCY MONO STRZ (ELECTRODE) ×3 IMPLANT
CHLORAPREP W/TINT 26ML (MISCELLANEOUS) ×5 IMPLANT
CLIP LIGATING HEM O LOK PURPLE (MISCELLANEOUS) ×1 IMPLANT
CLIP LIGATING HEMO LOK XL GOLD (MISCELLANEOUS) IMPLANT
CLIP LIGATING HEMO O LOK GREEN (MISCELLANEOUS) ×12 IMPLANT
CLIP SUT LAPRA TY ABSORB (SUTURE) ×6 IMPLANT
CORDS BIPOLAR (ELECTRODE) ×3 IMPLANT
COVER SURGICAL LIGHT HANDLE (MISCELLANEOUS) ×3 IMPLANT
COVER TIP SHEARS 8 DVNC (MISCELLANEOUS) ×1 IMPLANT
COVER TIP SHEARS 8MM DA VINCI (MISCELLANEOUS) ×2
CUTTER ECHEON FLEX ENDO 45 340 (ENDOMECHANICALS) IMPLANT
DECANTER SPIKE VIAL GLASS SM (MISCELLANEOUS) ×1 IMPLANT
DRAIN CHANNEL 15F RND FF 3/16 (WOUND CARE) ×3 IMPLANT
DRAPE INCISE IOBAN 66X45 STRL (DRAPES) ×3 IMPLANT
DRAPE LAPAROSCOPIC ABDOMINAL (DRAPES) ×3 IMPLANT
DRAPE SHEET LG 3/4 BI-LAMINATE (DRAPES) ×3 IMPLANT
DRAPE TABLE BACK 44X90 PK DISP (DRAPES) ×3 IMPLANT
DRAPE WARM FLUID 44X44 (DRAPE) ×3 IMPLANT
ELECT REM PT RETURN 9FT ADLT (ELECTROSURGICAL) ×3
ELECTRODE REM PT RTRN 9FT ADLT (ELECTROSURGICAL) ×1 IMPLANT
EVACUATOR SILICONE 100CC (DRAIN) ×3 IMPLANT
GLOVE BIO SURGEON STRL SZ 6.5 (GLOVE) ×2 IMPLANT
GLOVE BIO SURGEONS STRL SZ 6.5 (GLOVE) ×1
GLOVE BIOGEL M STRL SZ7.5 (GLOVE) ×6 IMPLANT
GOWN STRL REUS W/TWL LRG LVL3 (GOWN DISPOSABLE) ×6 IMPLANT
HEMOSTAT SURGICEL 4X8 (HEMOSTASIS) ×4 IMPLANT
KIT ACCESSORY DA VINCI DISP (KITS) ×2
KIT ACCESSORY DVNC DISP (KITS) ×1 IMPLANT
KIT BASIN OR (CUSTOM PROCEDURE TRAY) ×3 IMPLANT
LIQUID BAND (GAUZE/BANDAGES/DRESSINGS) ×5 IMPLANT
LOOP VESSEL MAXI BLUE (MISCELLANEOUS) ×3 IMPLANT
NDL INSUFFLATION 14GA 120MM (NEEDLE) ×1 IMPLANT
NEEDLE INSUFFLATION 14GA 120MM (NEEDLE) ×3 IMPLANT
NS IRRIG 1000ML POUR BTL (IV SOLUTION) ×1 IMPLANT
PEN SKIN MARKING BROAD (MISCELLANEOUS) ×3 IMPLANT
PENCIL BUTTON HOLSTER BLD 10FT (ELECTRODE) ×3 IMPLANT
POSITIONER SURGICAL ARM (MISCELLANEOUS) ×6 IMPLANT
POUCH SPECIMEN RETRIEVAL 10MM (ENDOMECHANICALS) ×3 IMPLANT
RELOAD WH ECHELON 45 (STAPLE) IMPLANT
SET TUBE IRRIG SUCTION NO TIP (IRRIGATION / IRRIGATOR) IMPLANT
SOLUTION ELECTROLUBE (MISCELLANEOUS) ×3 IMPLANT
SPONGE LAP 4X18 X RAY DECT (DISPOSABLE) ×3 IMPLANT
SURGIFLO W/THROMBIN 8M KIT (HEMOSTASIS) ×3 IMPLANT
SUT ETHILON 3 0 PS 1 (SUTURE) ×3 IMPLANT
SUT MNCRL AB 4-0 PS2 18 (SUTURE) ×6 IMPLANT
SUT PDS AB 1 CT1 27 (SUTURE) ×6 IMPLANT
SUT V-LOC BARB 180 2/0GR6 GS22 (SUTURE) ×9
SUT VIC AB 0 CT1 27 (SUTURE) ×18
SUT VIC AB 0 CT1 27XBRD ANTBC (SUTURE) IMPLANT
SUT VIC AB 2-0 SH 18 (SUTURE) ×3 IMPLANT
SUT VIC AB 3-0 SH 27 (SUTURE) ×3
SUT VIC AB 3-0 SH 27X BRD (SUTURE) IMPLANT
SUT VICRYL 0 UR6 27IN ABS (SUTURE) ×4 IMPLANT
SUT VLOC BARB 180 ABS3/0GR12 (SUTURE) ×3
SUTURE V-LC BRB 180 2/0GR6GS22 (SUTURE) ×1 IMPLANT
SUTURE VLOC BRB 180 ABS3/0GR12 (SUTURE) ×1 IMPLANT
TOWEL OR 17X26 10 PK STRL BLUE (TOWEL DISPOSABLE) ×6 IMPLANT
TOWEL OR NON WOVEN STRL DISP B (DISPOSABLE) ×3 IMPLANT
TRAY FOLEY BAG SILVER LF 16FR (CATHETERS) ×2 IMPLANT
TRAY FOLEY W/METER SILVER 14FR (SET/KITS/TRAYS/PACK) ×1 IMPLANT
TRAY LAPAROSCOPIC (CUSTOM PROCEDURE TRAY) ×3 IMPLANT
TROCAR 12M 150ML BLUNT (TROCAR) ×3 IMPLANT
TROCAR BLADELESS OPT 5 100 (ENDOMECHANICALS) ×1 IMPLANT
TROCAR UNIVERSAL OPT 12M 100M (ENDOMECHANICALS) ×3 IMPLANT
TROCAR XCEL 12X100 BLDLESS (ENDOMECHANICALS) ×3 IMPLANT
WATER STERILE IRR 1500ML POUR (IV SOLUTION) ×2 IMPLANT

## 2015-03-28 NOTE — Anesthesia Procedure Notes (Signed)
Procedure Name: Intubation Date/Time: 03/28/2015 1:11 PM Performed by: Deliah Boston Pre-anesthesia Checklist: Patient identified, Emergency Drugs available, Suction available and Patient being monitored Patient Re-evaluated:Patient Re-evaluated prior to inductionOxygen Delivery Method: Circle System Utilized Preoxygenation: Pre-oxygenation with 100% oxygen Intubation Type: IV induction Ventilation: Mask ventilation without difficulty Laryngoscope Size: Mac and 4 Tube type: Oral Tube size: 7.5 mm Number of attempts: 1 Airway Equipment and Method: Stylet and Oral airway Placement Confirmation: ETT inserted through vocal cords under direct vision,  positive ETCO2 and breath sounds checked- equal and bilateral Secured at: 22 cm Tube secured with: Tape Dental Injury: Teeth and Oropharynx as per pre-operative assessment  Comments: Intubation by K. Peterson SRNA, small nick to lower lip

## 2015-03-28 NOTE — Anesthesia Preprocedure Evaluation (Signed)
Anesthesia Evaluation  Patient identified by MRN, date of birth, ID band Patient awake    Reviewed: Allergy & Precautions, NPO status , Patient's Chart, lab work & pertinent test results  Airway Mallampati: II  TM Distance: >3 FB Neck ROM: Full    Dental no notable dental hx.    Pulmonary neg pulmonary ROS,  breath sounds clear to auscultation  Pulmonary exam normal       Cardiovascular hypertension, Pt. on medications Rhythm:Regular Rate:Normal     Neuro/Psych negative neurological ROS  negative psych ROS   GI/Hepatic negative GI ROS, Neg liver ROS,   Endo/Other  negative endocrine ROS  Renal/GU Renal disease  negative genitourinary   Musculoskeletal negative musculoskeletal ROS (+)   Abdominal   Peds negative pediatric ROS (+)  Hematology negative hematology ROS (+)   Anesthesia Other Findings   Reproductive/Obstetrics negative OB ROS                             Anesthesia Physical Anesthesia Plan  ASA: II  Anesthesia Plan: General   Post-op Pain Management:    Induction: Intravenous  Airway Management Planned: Oral ETT  Additional Equipment:   Intra-op Plan:   Post-operative Plan: Extubation in OR  Informed Consent: I have reviewed the patients History and Physical, chart, labs and discussed the procedure including the risks, benefits and alternatives for the proposed anesthesia with the patient or authorized representative who has indicated his/her understanding and acceptance.   Dental advisory given  Plan Discussed with: CRNA  Anesthesia Plan Comments:         Anesthesia Quick Evaluation

## 2015-03-28 NOTE — Brief Op Note (Signed)
03/28/2015  5:23 PM  PATIENT:  Sergio Ramos  69 y.o. male  PRE-OPERATIVE DIAGNOSIS:  LEFT RENAL MASS  POST-OPERATIVE DIAGNOSIS:  LEFT RENAL MASS  PROCEDURE:  Procedure(s): ROBOTIC ASSITED PARTIAL NEPHRECTOMY, INTRAOPERATIVE ULTRASOUND (Left)  SURGEON:  Surgeon(s) and Role:    * Alexis Frock, MD - Primary  PHYSICIAN ASSISTANT:   ASSISTANTS: 1 - Clemetine Marker, PA; 2 - Amaryllis Dyke, MD   ANESTHESIA:   general  EBL:  Total I/O In: 2000 [I.V.:2000] Out: 360 [Urine:160; Blood:200]  BLOOD ADMINISTERED:none  DRAINS: 1 - foley to straight drain; 2 - JP to bulb   LOCAL MEDICATIONS USED:  MARCAINE     SPECIMEN:  Source of Specimen:  1 - left renal mass; 2 - base of left renal mass #1; 3 - base of left renal mass #2  DISPOSITION OF SPECIMEN:  PATHOLOGY  COUNTS:  YES  TOURNIQUET:  * No tourniquets in log *  DICTATION: .Other Dictation: Dictation Number  [id number]  PLAN OF CARE: Admit to inpatient   PATIENT DISPOSITION:  PACU - hemodynamically stable.   Delay start of Pharmacological VTE agent (>24hrs) due to surgical blood loss or risk of bleeding: yes

## 2015-03-28 NOTE — Progress Notes (Signed)
Floor notified pt to arrive @1850 

## 2015-03-28 NOTE — H&P (Signed)
Sergio Ramos is an 69 y.o. male.    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 Chest CT negative. No adenopathy or adrenal masses. 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    3 - Nephrolithiasis -  Pre 2016 - SWL x several (he thinks bilateral), medical passage as well   CT 01/2015 with 47m left lower pole non-obstrucitng only, managed with observaiton, no colic.   4 - Chronic Renal Insufficiency - Cr 1.5 / GFR 50 by labs 2016.   5 - Moderate Risk Prostate Cancer - s/p IMRT 2008 for Gleason 6,7 disease.   Abbreviated Recent PSA History:  2010 - 1.2  2013 - 0.39  2016 - 0.50   PMH sig for umbilical hernia repair with mesh. His PCP is Sergio Ramos.    Today " Sergio Ramos " is seen to proceed with left robotic partial nephrectomy   Past Medical History  Diagnosis Date  . Hypertension   . Chronic kidney disease   . Radiation     hx of for prostate cancer   . Cancer     right kidney cancer and prostate cancer     Past Surgical History  Procedure Laterality Date  . Right nephrectomy     . Hernia repair      umbilical hernia repair   . Sch[place] from [place]       [place]     No family history on file. Social History:  reports that he has never smoked. He has never used smokeless tobacco. He reports that he drinks about 8.4 oz of alcohol per week. He reports that he does not use illicit drugs.  Allergies: No Known Allergies  No prescriptions prior to admission    Results for orders placed or performed during the hospital encounter of 03/26/15 (from the past 48 hour(s))  Type and  screen     Status: None   Collection Time: 03/26/15  1:35 PM  Result Value Ref Range   ABO/RH(D) A NEG    Antibody Screen NEG    Sample Expiration 04/09/2015   CBC     Status: None   Collection Time: 03/26/15  1:35 PM  Result Value Ref Range   WBC 5.5 4.0 - 10.5 K/uL   RBC 4.99 4.22 - 5.81 MIL/uL   Hemoglobin 15.5 13.0 - 17.0 g/dL   HCT 46.2 39.0 - 52.0 %   MCV 92.6 78.0 - 100.0 fL   MCH 31.1 26.0 - 34.0 pg   MCHC 33.5 30.0 - 36.0 g/dL   RDW 13.5 11.5 - 15.5 %   Platelets 216 150 - 400 K/uL  Basic metabolic panel     Status: Abnormal   Collection Time: 03/26/15  1:35 PM  Result Value Ref Range   Sodium 141 135 - 145 mmol/L   Potassium 4.3 3.5 - 5.1 mmol/L   Chloride 103 96 - 112 mmol/L   CO2 26 19 - 32 mmol/L   Glucose, Bld 96 70 - 99 mg/dL   BUN 17 6 - 23 mg/dL   Creatinine, Ser 1.12 0.50 - 1.35 mg/dL   Calcium 9.7 8.4 -  10.5 mg/dL   GFR calc non Af Amer 66 (L) >90 mL/min   GFR calc Af Amer 76 (L) >90 mL/min    Comment: (NOTE) The eGFR has been calculated using the CKD EPI equation. This calculation has not been validated in all clinical situations. eGFR's persistently <90 mL/min signify possible Chronic Kidney Disease.    Anion gap 12 5 - 15   No results found.  Review of Systems  Constitutional: Negative.  Negative for fever and chills.  HENT: Negative.   Eyes: Negative.   Respiratory: Negative.   Cardiovascular: Negative.   Gastrointestinal: Negative.   Genitourinary: Negative.   Musculoskeletal: Negative.   Skin: Negative.   Neurological: Negative.   Endo/Heme/Allergies: Negative.   Psychiatric/Behavioral: Negative.     There were no vitals taken for this visit. Physical Exam  Constitutional: He appears well-developed.  HENT:  Head: Normocephalic.  Eyes: Pupils are equal, round, and reactive to light.  Neck: Normal range of motion.  Cardiovascular: Normal rate.   Respiratory: Effort normal.  GI: Soft.  Genitourinary:  No CVAT  Musculoskeletal:  Normal range of motion.  Neurological: He is alert.  Skin: Skin is warm.  Psychiatric: He has a normal mood and affect. His behavior is normal. Judgment and thought content normal.     Assessment/Plan  1 - Multifocal Renal Cell Carcinoma - We rediscussed the role of partial nephrectomy with the overall goals being a balance of trying to achieve complete surgical excision (negative margins) while minimizing loss of normally functioning kidney. We then rediscussed surgical approaches including robotic and open techniques with robotic associated with a shorter convalescence. I showed the patient on their abdomen the approximately 4-6 incision (trocar) sites as well as presumed extraction sites with robotic approach as well as possible open incision sites. We specifically readdressed that there may be need to alter operative plans according to intraopertive findings including conversion to open procedure or conversion to radical nephrectomy as well as need for adjunctive procedures such as ureteral stenting to promote correct renal healing. We rediscussed specific peri-operative risks including bleeding, infection, deep vein thrombosis, pulmonary embolism, compartment syndrome, neuropathy / neuropraxia, heart attack, stroke, death, as well as long-term risks such as non-cure / need for additional therapy and need for imaging and lab based post-op surveillance protocols. We discussed typical hospital course of approximately 2 day hospitalization, need for peri-operative drains / catheters, and typical post-hospital course with return to most non-strenuous activities by 2 weeks and ability to return to most jobs and more strenuous activity such as exercise by 6 weeks.   After this lengthy and detail discussion, including answering all of the patient's questions to their satisfaction, they have chosen to proceed today as planned  2 - Peripelvic Renal Cysts- simple by recent contrast imaging, no intervention  warranted.  3 - Nephrolithiasis - stable non-obstructing stone burden in low risk location, observe.  4 - Chronic Renal Insufficiency - GFR acceptable, and would likely remain free from dialysis even with radical nephrectomy, but favor nephron-sparing as per above if possible.  5 - Moderate Risk Prostate Cancer - PSA approx stable.   Avanna Sowder 03/28/2015, 6:59 AM

## 2015-03-28 NOTE — Transfer of Care (Signed)
Immediate Anesthesia Transfer of Care Note  Patient: Sergio Ramos  Procedure(s) Performed: Procedure(s): ROBOTIC ASSITED PARTIAL NEPHRECTOMY, INTRAOPERATIVE ULTRASOUND (Left)  Patient Location: PACU  Anesthesia Type:General  Level of Consciousness: awake, alert , oriented and patient cooperative  Airway & Oxygen Therapy: Patient Spontanous Breathing and Patient connected to face mask oxygen  Post-op Assessment: Report given to RN, Post -op Vital signs reviewed and stable and Patient moving all extremities  Post vital signs: Reviewed and stable  Last Vitals:  Filed Vitals:   03/28/15 1039  BP: 140/84  Pulse: 77  Temp: 36.3 C  Resp: 18    Complications: No apparent anesthesia complications

## 2015-03-28 NOTE — Anesthesia Postprocedure Evaluation (Signed)
  Anesthesia Post-op Note  Patient: Sergio Ramos  Procedure(s) Performed: Procedure(s) (LRB): ROBOTIC ASSITED PARTIAL NEPHRECTOMY, INTRAOPERATIVE ULTRASOUND (Left)  Patient Location: PACU  Anesthesia Type: General  Level of Consciousness: awake and alert   Airway and Oxygen Therapy: Patient Spontanous Breathing  Post-op Pain: mild  Post-op Assessment: Post-op Vital signs reviewed, Patient's Cardiovascular Status Stable, Respiratory Function Stable, Patent Airway and No signs of Nausea or vomiting  Last Vitals:  Filed Vitals:   03/28/15 1900  BP: 151/88  Pulse: 88  Temp: 36.3 C  Resp: 22    Post-op Vital Signs: stable   Complications: No apparent anesthesia complications

## 2015-03-29 ENCOUNTER — Encounter (HOSPITAL_COMMUNITY): Payer: Self-pay | Admitting: Urology

## 2015-03-29 LAB — HEMOGLOBIN AND HEMATOCRIT, BLOOD
HCT: 40.2 % (ref 39.0–52.0)
Hemoglobin: 13.3 g/dL (ref 13.0–17.0)

## 2015-03-29 LAB — BASIC METABOLIC PANEL
ANION GAP: 3 — AB (ref 5–15)
BUN: 13 mg/dL (ref 6–23)
CO2: 27 mmol/L (ref 19–32)
Calcium: 8.3 mg/dL — ABNORMAL LOW (ref 8.4–10.5)
Chloride: 105 mmol/L (ref 96–112)
Creatinine, Ser: 1.29 mg/dL (ref 0.50–1.35)
GFR calc non Af Amer: 55 mL/min — ABNORMAL LOW (ref >90)
GFR, EST AFRICAN AMERICAN: 64 mL/min — AB (ref >90)
Glucose, Bld: 150 mg/dL — ABNORMAL HIGH (ref 70–99)
POTASSIUM: 4.7 mmol/L (ref 3.5–5.1)
Sodium: 135 mmol/L (ref 135–145)

## 2015-03-29 LAB — CREATININE, FLUID (PLEURAL, PERITONEAL, JP DRAINAGE): Creat, Fluid: 8.2 mg/dL

## 2015-03-29 LAB — TYPE AND SCREEN
ABO/RH(D): A NEG
ANTIBODY SCREEN: NEGATIVE

## 2015-03-29 MED ORDER — CETYLPYRIDINIUM CHLORIDE 0.05 % MT LIQD: 7.0000 mL | OROMUCOSAL | Status: DC | PRN

## 2015-03-29 NOTE — Care Management Note (Addendum)
    Page 1 of 1   03/30/2015     2:12:14 PM CARE MANAGEMENT NOTE 03/30/2015  Patient:  Sergio Ramos, Sergio Ramos   Account Number:  [id number]  Date Initiated:  03/29/2015  Documentation initiated by:  Dessa Phi  Subjective/Objective Assessment:   69 y/o m admitted w/Renal mass.     Action/Plan:   From home.   Anticipated DC Date:  03/30/2015   Anticipated DC Plan:  [place]  CM consult      Choice offered to / List presented to:             Status of service:  Completed, signed off Medicare Important Message given?   (If response is "NO", the following Medicare IM given date fields will be blank) Date Medicare IM given:   Medicare IM given by:   Date Additional Medicare IM given:   Additional Medicare IM given by:    Discharge Disposition:  HOME/SELF CARE  Per UR Regulation:  Reviewed for med. necessity/level of care/duration of stay  If discussed at [place] of Stay Meetings, dates discussed:    Comments:  03/30/15 Dessa Phi RN BSN NCM [phone] d/c home no needs or orders.  03/29/15 Dessa Phi RN BSN NCM [phone] POD#1 partial nephrectomy. No anticipated d/c needs.

## 2015-03-29 NOTE — Progress Notes (Addendum)
1 Day Post-Op Subjective: Complaining of abdominal distention and bloating. Moderate pain. No nausea. Minimal clears. On bedrest.  Objective: Vital signs in last 24 hours: Temp:  [97.3 F (36.3 C)-98.1 F (36.7 C)] 97.7 F (36.5 C) (04/21 0417) Pulse Rate:  [67-94] 67 (04/21 0417) Resp:  [12-136] 136 (04/21 0417) BP: (136-151)/(52-88) 148/80 mmHg (04/21 0417) SpO2:  [96 %-100 %] 100 % (04/21 0417) Weight:  [186 lb (84.369 kg)] 186 lb (84.369 kg) (04/20 1059)  Intake/Output from previous day: 04/20 0701 - 04/21 0700 In: 4156.3 [I.V.:4156.3] Out: 1610 [Urine:1100; Drains:310; Blood:200] Intake/Output this shift:    Physical Exam:  General: Alert and oriented CV: RRR Lungs: Clear Abdomen: Soft, ND Incisions: c/d/i JP: sang>serous Foley: light pink Ext: NT, No erythema  Lab Results:  Recent Labs  03/26/15 1335 03/28/15 1650 03/29/15 0423  HGB 15.5 14.7 13.3  HCT 46.2 43.2 40.2   BMET  Recent Labs  03/26/15 1335 03/29/15 0423  NA 141 135  K 4.3 4.7  CL 103 105  CO2 26 27  GLUCOSE 96 150*  BUN 17 13  CREATININE 1.12 1.29  CALCIUM 9.7 8.3*     Studies/Results: No results found.  Assessment/Plan: POD#1 from left robotic PNx of deep tumor extending into collecting system. Doing well. -- Advance diet -- PO pain meds -- D/C foley -- Check JP for Cr -- Monitor today -- Repeat Hgb/Hct tomorrow -- Likely D/C tomorrow   LOS: 1 day   JOHNSON, DAVID C 03/29/2015, 7:04 AM   S:  1- Left Renal Mass - s/p robotic left partial nephrectomy 03/28/15 for very endophytic renal mass. Foley removed POD 1. JP Cr POD 8.  O: Labs as per above NAD AOx3 Mild abd TTP, incision sites c/d/i JP with serosanguinous output SCD's in place  A/P  -doing well POD1. Foley out, Decrease IVF. Remain in house and recheck BMP, Hgb in AM. Discussed possibility of need for JP at DC is still moderate output with elevated JP Cr v. Need for stent / foley if very high output v. DC  if output scant and recheck JP Cr improved.

## 2015-03-29 NOTE — Op Note (Signed)
NAMEKABEER, HOAGLAND NO.:  [id number]  MEDICAL RECORD NO.:  [id number]  LOCATION:  82                         FACILITY:  [facility]  PHYSICIAN:  Alexis Frock, MD     DATE OF BIRTH:  05-12-1946  DATE OF PROCEDURE: 03/28/2015 DATE OF DISCHARGE:                              OPERATIVE REPORT   DIAGNOSIS:  Enhancing left renal mass worrisome for renal cell carcinoma.  PROCEDURES: 1. Robotic-assisted laparoscopic left partial nephrectomy. 2. Intraoperative ultrasound with interpretation.  ESTIMATED BLOOD LOSS:  200 mL.  WARM ISCHEMIA TIME:  29 minutes.  COMPLICATIONS:  Minor break in the sterility, crucial instrument having touched, nonsterile surface was re-sterilized intraoperatively and briefly used again as was crucial to avoid excess warm ischemia time and renal compromise.  SPECIMENS: 1. Left renal mass. 2. Base of left renal mass #1. 3. Base of left renal mass #2.  FINDINGS: 1. Approximately 90% endophytic solid left renal mass by     intraoperative ultrasound and visual inspection. 2. Single very early branching artery, single vein, left renovascular     anatomy.  ASSISTANTS: 1. Clemetine Marker, PA. 2. Francoise Schaumann, MD.  DRAINS: 1. Jackson-Pratt drain to bulb suction. 2. Foley catheter to straight drain.  INDICATION:  Mr. Bennison is a very pleasant 69 year old gentleman with history of contralateral renal cell carcinoma, status post partial nephrectomy years ago.  He also has history of nephrolithiasis.  He was found on surveillance of nephrolithiasis and prior malignancy to have a new enhancing left renal mass.  Staging studies were unrevealing for any distant or locally advanced disease.  This mass was quite endophytic and enhancing and worrisome for renal cell carcinoma.  Options were discussed for management including ablative therapies versus surgical extirpation with and without nephron sparing.  The patient understood and wished to  proceed with left partial nephrectomy with minimally invasive assistance.  We discussed preoperatively given the very endophytic nature of the lesion, there was a significant chance of conversion to radical nephrectomy.  He had good understanding of this. Informed consent was obtained and placed in the medial record.  PROCEDURE IN DETAIL:  The patient being Keddrick Wyne, was verified. Procedure being left robotic partial nephrectomy was confirmed. Procedure was carried out.  Time-out was performed.  Intravenous antibiotics were administered.  General endotracheal anesthesia was introduced.  Foley catheter was placed per urethra to straight drain. The patient was placed in left side up full flank position applying 15 degrees stable flexion, superior arm elevator, sequential compression devices, bottom leg bent and top leg straight.  He was further fashioned on the operative table using 3-inch tape over foam padding.  Sterile field was created by prepping and draping the patient's entire left flank and abdomen using chlorhexidine gluconate.  Next, a high-flow, low- pressure pneumoperitoneum was obtained using Veress technique in the left lower quadrant having passed the aspiration and drop test.  This position was purposely chosen and well away from the area of prior umbilical hernia repair with mesh.  Next, a 12-mm robotic camera port was placed in position approximately 1 handbreadth superolateral to the umbilicus again well away from the area of suspected mesh hernia repair. Laparoscopic examination of  the peritoneal cavity revealed no significant adhesions and no visceral injury within the planned area of dissection.  There were some loose omental adhesions over the area of prior hernia repair with mesh.  I felt that this would not need to be adhesiolysed. Additional ports were then placed as follows:  The left paramedian inferior 8-mm robotic port approximately 1  handbreadth superior to the pubic ramus; left far lateral 8-mm robotic port approximately 4 fingerbreadths superomedial to the anterior superior iliac spine; 8-mm robotic port in the subcostal location on the left side; a 12-mm assistant port in the midline approximately 2 fingerbreadths above the camera port and a 12-mm assistant port in the paramedian location approximately 3 fingerbreadths below the camera port site and placed under direct laparoscopic vision lateral to the area of prior hernia repair with mesh.  Robot was docked and passed through the electronic checks.  Then, attention was directed to the development of the left retroperitoneum.  Incision was made lateral to the descending colon from the splenic flexure towards the area of the internal ring and the colon was carefully swept medially.  The lower pole of the kidney was identified and placed on gentle lateral traction and proceeded medial to this.  The ureter and gonadal vessels were encountered and the ureter was dissected away from the gonadal vessels.  The gonadal vessels were being allowed to rotate medially.  There was significant amount of retroperitoneal fat and the kidney was somewhat large as expected. Dissection was proceeded in this triangle superiorly towards the area of the renal hilum with a single very early branching artery and single vein, left renovascular anatomy and these were each marked with vessel loops.  Attention was directed to indication of the tumor and dissection proceeded directly onto the lateral anterior aspect of the kidney. There was significant desmoplastic reaction around the surface of the kidney consistent with likely prior shockwave lithotripsy therapy and the lateral half of the kidney was completely defatted in the anterior and posterior planes from the upper and lower pole respectively.  The area of tumor was not at all readily identifiable with the naked eye and as such,  intraoperative ultrasound was performed.  Intraoperative ultrasound revealed a well-circumscribed incredibly endophytic mass in the position as expected and the superolateral aspect of the left kidney.  Using ultrasound guidance, the edges of the lesion were very carefully marked on the external surface of the kidney to guide partial nephrectomy.    A 12 g mannitol was given intravenously and warm ischemia was achieved with double clamping the artery alone using the bulldog clamps.  Next, very careful partial nephrectomy was performed using cold scissors at the previously marked margins keeping what appeared to be at approximately 2-3 mm rim of normal parenchyma with the tumor specimen.  Several points within the dissection of the tumor were visualized and the point of dissection was changed purposely; however, no point was obviously entered, and as expected, the tumor base abutted the renal sinus fat in the collecting system and several indexes were made in the collecting system purposely with effort to achieve negative margins.  This left renal mass was then set aside.  Two separate bases of the specimen were set aside for permanent pathology.  One as the deep space and the second as a #2, being slightly deeper.  Renorrhaphy was then performed on the first layer reapproximating several obvious inferences in the collecting system as well as some open venous sinuses using running 3-0 V-Loc  suture x2.  Next, two bolsters were applied in the partial nephrectomy bed and parenchymal apposition sutures were applied using interrupted 0 Vicryl x4, that seen between the Hem-o-lok and lapper ties to provide parenchymal apposition.  Following these maneuvers, the bulldog clamps were released for total amount of ischemia time of 29 minutes and hemostasis was felt to be acceptable.  Notably during the warm ischemia time, the Bulldog applier device was then inadvertently displaced, became  contaminated touching against the nonsterile surface.  There was no immediately available second Bulldog retriever tray available, and as such, the instrument was resterilized as best as possible using Betadine and the Bulldog clamps were carefully retrieved avoiding any excess exposure of the instrument to any surface within the abdominal cavity.  It was felt to be the best balance between sterility and excessive warm ischemia time and renal compromise.  A 10 mL of FloSeal was applied over the partial nephrectomy bed and the perirenal fat was reapproximated using running Vicryl.  The retroperitoneum was reapproximated using running V-Loc suture bringing the lateral posterior peritoneum back across the anterior surface of the kidney.  Closed suction drain was then brought through the previous left lateral most robotic port site near the peritoneal cavity.  At this point, all sponge and needle counts were correct with excellent hemostasis.  Robot was then undocked.  Specimen was retrieved by extending the previous superior most assistant port site resulted in approximately 3 cm removing the specimen and setting it aside for permanent pathology.  This site was closed at the level of the fascia using figure-of-eight PDS x3 followed by reapproximation of Scarpa's using running Vicryl.  All incision sites were infiltrated with dilute lyophilized Marcaine.  The previous 12-mm robotic port site and inferior assistant port site were closed at the level of the fascia using figure- of-eight Vicryl and UR needle, and all incision sites were closed at the level of the skin using subcuticular Monocryl followed by Dermabond. Procedure was then terminated.  The patient tolerated the procedure well.  There were no immediate periprocedural complications.  The patient was taken to the postanesthesia care unit in stable condition.          ______________________________ Alexis Frock,  MD     TM/MEDQ  D:  03/28/2015  T:  03/29/2015  Job:  [id number]

## 2015-03-30 LAB — HEMOGLOBIN AND HEMATOCRIT, BLOOD
HEMATOCRIT: 39.7 % (ref 39.0–52.0)
Hemoglobin: 13 g/dL (ref 13.0–17.0)

## 2015-03-30 LAB — BASIC METABOLIC PANEL
ANION GAP: 5 (ref 5–15)
BUN: 8 mg/dL (ref 6–23)
CALCIUM: 8.3 mg/dL — AB (ref 8.4–10.5)
CO2: 27 mmol/L (ref 19–32)
Chloride: 102 mmol/L (ref 96–112)
Creatinine, Ser: 1.1 mg/dL (ref 0.50–1.35)
GFR calc Af Amer: 78 mL/min — ABNORMAL LOW (ref >90)
GFR, EST NON AFRICAN AMERICAN: 67 mL/min — AB (ref >90)
Glucose, Bld: 128 mg/dL — ABNORMAL HIGH (ref 70–99)
Potassium: 3.9 mmol/L (ref 3.5–5.1)
Sodium: 134 mmol/L — ABNORMAL LOW (ref 135–145)

## 2015-03-30 LAB — CREATININE, FLUID (PLEURAL, PERITONEAL, JP DRAINAGE): CREAT FL: 10.8 mg/dL

## 2015-03-30 MED ORDER — SULFAMETHOXAZOLE-TRIMETHOPRIM 800-160 MG PO TABS
1.0000 | ORAL_TABLET | Freq: Every day | ORAL | Status: DC
Start: 2015-03-30 — End: 2016-09-08

## 2015-03-30 MED ORDER — SENNOSIDES-DOCUSATE SODIUM 8.6-50 MG PO TABS: 1.0000 | ORAL_TABLET | Freq: Two times a day (BID) | ORAL | Status: DC

## 2015-03-30 NOTE — Progress Notes (Signed)
2 Days Post-Op Subjective: 1- Left Renal Mass - s/p robotic left partial nephrectomy 03/28/15 for very endophytic renal mass. Foley removed POD 1. JP Cr POD 8.  Complaining of abdominal distention and bloating. Moderate pain. No nausea. Minimal clears. On bedrest.  Objective: Vital signs in last 24 hours: Temp:  [97.5 F (36.4 C)-99.1 F (37.3 C)] 99 F (37.2 C) (04/22 0626) Pulse Rate:  [65-77] 72 (04/22 0626) Resp:  [20] 20 (04/22 0626) BP: (122-154)/(65-80) 154/79 mmHg (04/22 0626) SpO2:  [92 %-94 %] 94 % (04/22 0626)  Intake/Output from previous day: 04/21 0701 - 04/22 0700 In: 2283.3 [P.O.:640; I.V.:1643.3] Out: 3175 [Urine:2905; Drains:270] Intake/Output this shift: Total I/O In: 240 [P.O.:240] Out: 10 [Drains:10]  Physical Exam:  General: Alert and oriented CV: RRR Lungs: Clear Abdomen: Soft, ND Incisions: c/d/i JP: sang>serous Foley: light pink Ext: NT, No erythema  Lab Results:  Recent Labs  03/28/15 1650 03/29/15 0423 03/30/15 0345  HGB 14.7 13.3 13.0  HCT 43.2 40.2 39.7   BMET  Recent Labs  03/29/15 0423 03/30/15 0345  NA 135 134*  K 4.7 3.9  CL 105 102  CO2 27 27  GLUCOSE 150* 128*  BUN 13 8  CREATININE 1.29 1.10  CALCIUM 8.3* 8.3*     Studies/Results: No results found.  Assessment/Plan: POD#1 from left robotic PNx of deep tumor extending into collecting system. Doing well. -- Advance diet -- PO pain meds -- D/C foley -- Check JP for Cr -- Monitor today -- Repeat Hgb/Hct tomorrow -- Likely D/C tomorrow   LOS: 2 days   Lyndsy Gilberto C 03/30/2015, 10:35 AM  A/P  -doing well POD1. Foley out, Decrease IVF. Remain in house and recheck BMP, Hgb in AM. Discussed possibility of need for JP at DC is still moderate output with elevated JP Cr v. Need for stent / foley if very high output v. DC if output scant and recheck JP Cr improved.

## 2015-03-30 NOTE — Discharge Instructions (Signed)
1.  Activity:  You are encouraged to ambulate frequently (about every hour during waking hours) to help prevent blood clots from forming in your legs or lungs.  However, you should not engage in any heavy lifting (> 10-15 lbs), strenuous activity, or straining. 2. Diet: You should advance your diet as instructed by your physician.  It will be normal to have some bloating, nausea, and abdominal discomfort intermittently. 3. Prescriptions:  You will be provided a prescription for pain medication to take as needed.  If your pain is not severe enough to require the prescription pain medication, you may take extra strength Tylenol instead which will have less side effects.  You should also take a prescribed stool softener to avoid straining with bowel movements as the prescription pain medication may constipate you. 4. Incisions: You may remove your dressing bandages 48 hours after surgery if not removed in the hospital.  You will either have some small staples or special tissue glue at each of the incision sites. Once the bandages are removed (if present), the incisions may stay open to air.  You may start showering (but not soaking or bathing in water) the 2nd day after surgery and the incisions simply need to be patted dry after the shower.  No additional care is needed. 5. What to call us about: You should call the office [phone]) if you develop fever > 101 or develop persistent vomiting.       6. You may resume aspirin, aleve, advil, vitamins, and supplements 7 days after surgery.  7. Please keep daily log of drain output. Dr. Tresa Moore will call you with pathology results in a few days and review daily totals at that time.

## 2015-03-30 NOTE — Discharge Summary (Signed)
Physician Discharge Summary  Patient ID: Sergio Ramos MRN: [id number] DOB/AGE: July 18, 1946 69 y.o.  Admit date: 03/28/2015 Discharge date: 03/30/2015  Admission Diagnoses: Left Renal Mass  Discharge Diagnoses:  Active Problems:   Renal mass Small Volume left renal urine leak  Discharged Condition: good  Hospital Course:   1- Left Renal Mass - s/p robotic left partial nephrectomy 03/28/15 for very endophytic renal mass. Foley removed POD 1 and pt voiding w/o problems after. Path pending at discharge.   2 - Small Volume Urine Leak -  JP Cr 8 versus serum of <1.5 c/w urine leak POD 1. Foley removed and JP output withotu increase.  JP output <10% total volume of urine output and trending down at discharge. Will keep JP at discharge with pt to record daily outputs and notify us if trending up.  By POD 2, the day of discharge, pt ambulatory, pain controlled with PO meds, tollerating diet with return of flatus, and felt to be adequate for discharge.   Consults: None  Significant Diagnostic Studies: labs: as per above, surgical pathology pending at discharge.  Treatments: surgery:  robotic left partial nephrectomy 03/28/15  Discharge Exam: Blood pressure 154/79, pulse 72, temperature 99 F (37.2 C), temperature source Oral, resp. rate 20, height 5\' 9"  (1.753 m), weight 84.369 kg (186 lb), SpO2 94 %. General appearance: alert, cooperative and appears older than stated age Head: Normocephalic, without obvious abnormality, atraumatic Nose: Nares normal. Septum midline. Mucosa normal. No drainage or sinus tenderness. Throat: lips, mucosa, and tongue normal; teeth and gums normal Neck: supple, symmetrical, trachea midline Back: symmetric, no curvature. ROM normal. No CVA tenderness. Resp: non-labored on room air Cardio: normal rate GI: soft, non-tender; bowel sounds normal; no masses,  no organomegaly Male genitalia: normal Extremities: extremities normal, atraumatic, no cyanosis or  edema Pulses: 2+ and symmetric Skin: Skin color, texture, turgor normal. No rashes or lesions Lymph nodes: Cervical, supraclavicular, and axillary nodes normal. Neurologic: Grossly normal Incision/Wound: recent incision sites c/d/i. JP with serosanguinous output not large volume.  Disposition:      Medication List    STOP taking these medications        aspirin EC 81 MG tablet      TAKE these medications        hydrochlorothiazide 25 MG tablet  Commonly known as:  HYDRODIURIL  Take 25 mg by mouth every morning.     HYDROcodone-acetaminophen 5-325 MG per tablet  Commonly known as:  NORCO  Take 1-2 tablets by mouth every 6 (six) hours as needed.     senna-docusate 8.6-50 MG per tablet  Commonly known as:  Senokot-S  Take 1 tablet by mouth 2 (two) times daily. While taking pain meds to prevent constipation     simvastatin 40 MG tablet  Commonly known as:  ZOCOR  Take 40 mg by mouth daily.     sulfamethoxazole-trimethoprim 800-160 MG per tablet  Commonly known as:  BACTRIM DS,SEPTRA DS  Take 1 tablet by mouth daily. To prevent infection with drain in place           Follow-up Information    Follow up with Alexis Frock, MD On 04/12/2015.   Specialty:  Urology   Why:  at 11:15   Contact information:   [place] [address] [phone]       Signed: Alexis Frock 03/30/2015, 12:56 PM

## 2015-03-31 LAB — PSA: PSA: 0.31 ng/mL (ref <=4.00)

## 2015-10-23 ENCOUNTER — Ambulatory Visit (HOSPITAL_COMMUNITY)
Admission: RE | Admit: 2015-10-23 | Discharge: 2015-10-23 | Disposition: A | Discharge status: Home / Self Care | Payer: Medicare Other | Source: Ambulatory Visit | Attending: Urology | Admitting: Urology

## 2015-10-23 ENCOUNTER — Other Ambulatory Visit: Payer: Self-pay | Admitting: Urology

## 2015-10-23 DIAGNOSIS — C642 Malignant neoplasm of left kidney, except renal pelvis: Secondary | ICD-10-CM

## 2016-08-12 ENCOUNTER — Telehealth: Payer: Self-pay | Admitting: Cardiovascular Disease

## 2016-08-12 NOTE — Telephone Encounter (Signed)
Received records from [place] for appointment with Dr Sallyanne Kuster on 09/08/16.  Records given to [facility] (medical records) for Dr Croitoru's schedule on 09/08/16. lp

## 2016-09-08 ENCOUNTER — Encounter: Payer: Self-pay | Admitting: Surgery

## 2016-09-08 ENCOUNTER — Ambulatory Visit (INDEPENDENT_AMBULATORY_CARE_PROVIDER_SITE_OTHER): Payer: Medicare Other | Admitting: Cardiovascular Disease

## 2016-09-08 ENCOUNTER — Encounter: Payer: Self-pay | Admitting: Cardiovascular Disease

## 2016-09-08 VITALS — BP 160/82 | HR 64 | Ht 69.0 in | Wt 186.8 lb

## 2016-09-08 DIAGNOSIS — I10 Essential (primary) hypertension: Secondary | ICD-10-CM

## 2016-09-08 DIAGNOSIS — E782 Mixed hyperlipidemia: Secondary | ICD-10-CM

## 2016-09-08 DIAGNOSIS — R9431 Abnormal electrocardiogram [ECG] [EKG]: Secondary | ICD-10-CM | POA: Diagnosis not present

## 2016-09-08 DIAGNOSIS — Z8546 Personal history of malignant neoplasm of prostate: Secondary | ICD-10-CM

## 2016-09-08 DIAGNOSIS — Z85528 Personal history of other malignant neoplasm of kidney: Secondary | ICD-10-CM | POA: Diagnosis not present

## 2016-09-08 NOTE — Patient Instructions (Signed)
Your physician has requested that you have en exercise stress myoview. For further information please visit [email or web address]. Please follow instruction sheet, as given.  Dr Sallyanne Kuster recommends that you follow-up with him as needed.

## 2016-09-08 NOTE — Progress Notes (Signed)
Cardiology Consultation Note    Date:  09/08/2016   ID:  ATWELL RUSSELLO, DOB 31-Jan-1946, MRN [id number]  PCP:  Mayra Neer, MD  Cardiologist:   Sanda Klein, MD  Reason for consultation: Strong family history of premature CAD, multiple coronary risk factors  Chief Complaint  Patient presents with  . New Patient (Initial Visit)    Pt states no Sx. establishing care. scheduling for stress test.     History of Present Illness:  HUW DEMUTH Jolaine Click") is a 70 y.o. male with hypertension and mixed hyperlipidemia referred for discussion of family history of early coronary artery disease. He had a stress test about 5 years ago that was normal. He was able to exercise for 11 minutes and 30 seconds on the Bruce protocol and had normal perfusion images.   He has not had any interim problems with chest pain, dyspnea, but is unable to perform intense physical activity due to his pelvic symptoms. He also denies palpitations, syncope, edema, focal neurological events or ointment claudication.  His father had a heart attack at age 43 and after hospitalization, died after a brief illness at home. His brother had bypass surgery and is alive at age 34. Both his sister and his mother have had Alzheimer's dementia but has not had cardiac problems.  [name] has never smoked. He has a history of Agent Orange exposure in [place].  He has significant mixed hyperlipidemia despite compliance with treatment with simvastatin: calculated LDL of 122 and triglycerides of 201. Blood pressure control has generally been good on the diuretic monotherapy. He does not have diabetes mellitus but did have a borderline  fasting glucose of 106.  He has had numerous urological problems. 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 He has had lifelong frequent nephrolithiasis. In 2009 following an  episode of nephrolithiasis who was incidentally found to have a small right renal cell carcinoma and had a partial nephrectomy. During routine follow-up use found to have a contralateral left renal cell carcinoma that has also been resected April 2016). 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  Past Medical History:  Diagnosis Date  . Cancer [facility])    right kidney cancer and prostate cancer   . Chronic kidney disease   . Hypertension   . Radiation    hx of for prostate cancer     Past Surgical History:  Procedure Laterality Date  . HERNIA REPAIR     umbilical hernia repair   . right nephrectomy     . ROBOTIC ASSITED PARTIAL NEPHRECTOMY Left 03/28/2015   Procedure: ROBOTIC ASSITED PARTIAL NEPHRECTOMY, INTRAOPERATIVE ULTRASOUND;  Surgeon: Alexis Frock, MD;  Location: [facility];  Service: Urology;  Laterality: Left;  . schrapnel removed from back      during [place] war     Current Medications: Outpatient Medications Prior to Visit  Medication Sig Dispense Refill  . hydrochlorothiazide (HYDRODIURIL) 25 MG tablet Take 25 mg by mouth every morning.    . simvastatin (ZOCOR) 40 MG tablet Take 40 mg by mouth daily.    Marland Kitchen HYDROcodone-acetaminophen (NORCO) 5-325 MG per tablet Take 1-2 tablets by mouth every 6 (six) hours as needed. 30 tablet 0  . senna-docusate (SENOKOT-S) 8.6-50 MG  per tablet Take 1 tablet by mouth 2 (two) times daily. While taking pain meds to prevent constipation 30 tablet 0  . sulfamethoxazole-trimethoprim (BACTRIM DS,SEPTRA DS) 800-160 MG per tablet Take 1 tablet by mouth daily. To prevent infection with drain in place 30 tablet 0   No facility-administered medications prior to visit.      Allergies:   Review of patient's allergies indicates no known allergies.   Social History   Social  History  . Marital status: Married    Spouse name: N/A  . Number of children: N/A  . Years of education: N/A   Social History Main Topics  . Smoking status: Never Smoker  . Smokeless tobacco: Never Used  . Alcohol use 8.4 oz/week    14 Glasses of wine per week  . Drug use: No  . Sexual activity: Not Asked   Other Topics Concern  . None   Social History Narrative  . None     Family History:  The patient's Family history significant for premature onset coronary disease in his father and his brother  ROS:   Please see the history of present illness.    ROS All other systems reviewed and are negative.   PHYSICAL EXAM:   VS:  BP (!) 160/82   Pulse 64   Ht 5\' 9"  (1.753 m)   Wt 84.7 kg (186 lb 12.8 oz)   BMI 27.59 kg/m    GEN: Well nourished, well developed, in no acute distress  HEENT: normal  Neck: no JVD, carotid bruits, or masses Cardiac: RRR; no murmurs, rubs, or gallops,no edema  Respiratory:  clear to auscultation bilaterally, normal work of breathing GI: soft, nontender, nondistended, + BS MS: no deformity or atrophy  Skin: warm and dry, no rash Neuro:  Alert and Oriented x 3, Strength and sensation are intact Psych: euthymic mood, full affect  Wt Readings from Last 3 Encounters:  09/08/16 84.7 kg (186 lb 12.8 oz)  03/28/15 84.4 kg (186 lb)  03/26/15 84.4 kg (186 lb)      Studies/Labs Reviewed:   EKG:  EKG is ordered today.  The ekg ordered today demonstrates Normal sinus rhythm. There is T-wave inversion in lead 3 that is much more prominent than on previous ECG in 2016 and there is also a subtle new T-wave inversion in lead aVF 4 there is also mild downsloping ST segment depression. QTC normal 410 ms  Recent Labs: Creatinine 1.2, potassium 4.2, normal liver function tests except total bili 1.7, PSA 0.3  Lipid Panel Total cholesterol 227, triglycerides 201, HDL 65, calculated LDL 122  Additional studies/ records that were reviewed today include:    Notes from Dr. Mayra Neer and Dr. Tresa Moore     ASSESSMENT:    1. Abnormal EKG   2. Abnormal ECG   3. Mixed hyperlipidemia   4. Essential hypertension   5. History of renal cell carcinoma   6. History of prostate cancer      PLAN:  In order of problems listed above:  1. Abnormal ECG: Asymptomatic. Although changes are subtle, they are definitely significant when compared with 2016. He remains asymptomatic. Repeat myocardial perfusion scanning is indicated. I don't think plain treadmill stress testing with suffice with a abnormal baseline ECG and limited ability to exercise due to urological issues. 2. HLP: His lipid profile is not too bad, but if coronary disease is identified more aggressive treatment is definitely indicated (to a target LDL of less than 100, peripheral less than  70). This might require switching to a more aggressive statin such as rosuvastatin or atorvastatin. For the time being I encouraged him to take more attention to diet and to find some type of physical exercise that he can perform, even if we change him to more potent statin he needs to address borderline hyperglycemia and overweight status. 3. HTN: His blood pressure was rather high when he first checked in today, but improved as the appointment progressed. At his most recent appointment with Dr. Brigitte Pulse his blood pressure was 120/70. Will have the opportunity check his blood pressure repeatedly at the time of the stress test. No changes are made to his medications today. 4. Status post bilateral partial nephrectomy for multifocal renal cell carcinoma and status post external beam radiation therapy for prostate cancer.  If his stress test shows normal findings, follow-up with cardiology as needed. If the stress test is abnormal will bring him in for an appointment to discuss cardiac catheterization.  Medication Adjustments/Labs and Tests Ordered: Current medicines are reviewed at length with the patient today.   Concerns regarding medicines are outlined above.  Medication changes, Labs and Tests ordered today are listed in the Patient Instructions below. Patient Instructions  Your physician has requested that you have en exercise stress myoview. For further information please visit [email or web address]. Please follow instruction sheet, as given.  Dr Sallyanne Kuster recommends that you follow-up with him as needed.    Signed, Sanda Klein, MD  09/08/2016 8:10 PM    [facility] [address], [place], [place]  [address] Phone: [phone]; Fax: [phone]

## 2016-09-09 NOTE — Addendum Note (Signed)
Addended by: Diana Eves on: 09/09/2016 12:34 PM   Modules accepted: Orders

## 2016-09-17 ENCOUNTER — Telehealth (HOSPITAL_COMMUNITY): Payer: Self-pay

## 2016-09-17 NOTE — Telephone Encounter (Signed)
Encounter complete. 

## 2016-09-18 ENCOUNTER — Ambulatory Visit (HOSPITAL_COMMUNITY)
Admission: RE | Admit: 2016-09-18 | Discharge: 2016-09-18 | Disposition: A | Discharge status: Home / Self Care | Payer: Medicare Other | Source: Ambulatory Visit | Attending: Cardiovascular Disease | Admitting: Cardiovascular Disease

## 2016-09-18 DIAGNOSIS — R9431 Abnormal electrocardiogram [ECG] [EKG]: Secondary | ICD-10-CM | POA: Diagnosis not present

## 2016-09-18 LAB — MYOCARDIAL PERFUSION IMAGING
CHL CUP NUCLEAR SRS: 2
CSEPPHR: 166 {beats}/min
Estimated workload: 12 METS
Exercise duration (min): 10 min
Exercise duration (sec): 10 s
LVDIAVOL: 66 mL (ref 62–150)
LVSYSVOL: 20 mL
MPHR: 151 {beats}/min
NUC STRESS TID: 0.87
Percent HR: 109 %
RPE: 17
Rest HR: 63 {beats}/min
SDS: 3
SSS: 5

## 2016-09-18 MED ORDER — TECHNETIUM TC 99M TETROFOSMIN IV KIT
10.9000 | PACK | Freq: Once | INTRAVENOUS | Status: AC | PRN
  Administered 2016-09-18: 10.9 via INTRAVENOUS
  Filled 2016-09-18: qty 11

## 2016-09-18 MED ORDER — TECHNETIUM TC 99M TETROFOSMIN IV KIT
32.3000 | PACK | Freq: Once | INTRAVENOUS | Status: AC | PRN
  Administered 2016-09-18: 32.3 via INTRAVENOUS
  Filled 2016-09-18: qty 33

## 2016-09-19 ENCOUNTER — Telehealth: Payer: Self-pay

## 2016-09-19 MED ORDER — LOSARTAN POTASSIUM 50 MG PO TABS: 50.0000 mg | ORAL_TABLET | Freq: Every day | ORAL | 3 refills | Status: DC

## 2016-09-19 MED ORDER — LOSARTAN POTASSIUM 50 MG PO TABS: 50.0000 mg | ORAL_TABLET | Freq: Every day | ORAL | 0 refills | Status: DC

## 2016-09-19 NOTE — Telephone Encounter (Signed)
-----   Message from Sanda Klein, MD sent at 09/18/2016  6:28 PM EDT ----- Normal nuclear stress test. Blood pressure was a little high before, during and after the stress test. His blood pressure was also high when I saw him in the office. I think his current blood pressure medicine is insufficient. I would recommend adding losartan 50 mg once daily and continue taking hydrochlorothiazide.

## 2016-09-19 NOTE — Telephone Encounter (Signed)
Called patient with results.  Medication sent to patient's preferred pharmacies electronically. Patient wanted to be seen in follow-up since he will be starting a new medication (initially to be seen PRN). Appointment made for 11/19/16 at 1030. Notified patient to start taking his blood pressure regularly and keep a record to bring with him to this appointment. Patient verbalized understanding, agreed with plan, and voiced appreciation for phone call.

## 2016-10-16 ENCOUNTER — Ambulatory Visit (HOSPITAL_COMMUNITY)
Admission: RE | Admit: 2016-10-16 | Discharge: 2016-10-16 | Disposition: A | Discharge status: Home / Self Care | Payer: Medicare Other | Source: Ambulatory Visit | Attending: Urology | Admitting: Urology

## 2016-10-16 ENCOUNTER — Other Ambulatory Visit: Payer: Self-pay | Admitting: Urology

## 2016-10-16 DIAGNOSIS — Z85528 Personal history of other malignant neoplasm of kidney: Secondary | ICD-10-CM | POA: Insufficient documentation

## 2016-10-16 DIAGNOSIS — R911 Solitary pulmonary nodule: Secondary | ICD-10-CM | POA: Insufficient documentation

## 2016-11-08 ENCOUNTER — Other Ambulatory Visit: Payer: Self-pay | Admitting: Cardiovascular Disease

## 2016-11-10 NOTE — Telephone Encounter (Signed)
Rx(s) sent to pharmacy electronically.  

## 2016-11-20 ENCOUNTER — Ambulatory Visit (INDEPENDENT_AMBULATORY_CARE_PROVIDER_SITE_OTHER): Payer: Medicare Other | Admitting: Cardiovascular Disease

## 2016-11-20 ENCOUNTER — Encounter: Payer: Self-pay | Admitting: Cardiovascular Disease

## 2016-11-20 VITALS — BP 118/74 | HR 67 | Ht 69.0 in | Wt 179.0 lb

## 2016-11-20 DIAGNOSIS — Z85528 Personal history of other malignant neoplasm of kidney: Secondary | ICD-10-CM

## 2016-11-20 DIAGNOSIS — E782 Mixed hyperlipidemia: Secondary | ICD-10-CM | POA: Diagnosis not present

## 2016-11-20 DIAGNOSIS — I10 Essential (primary) hypertension: Secondary | ICD-10-CM

## 2016-11-20 DIAGNOSIS — R9431 Abnormal electrocardiogram [ECG] [EKG]: Secondary | ICD-10-CM | POA: Diagnosis not present

## 2016-11-20 DIAGNOSIS — Z8546 Personal history of malignant neoplasm of prostate: Secondary | ICD-10-CM

## 2016-11-20 NOTE — Patient Instructions (Signed)
Dr Croitoru recommends that you follow-up with him as needed. 

## 2016-11-20 NOTE — Progress Notes (Signed)
Cardiology Consultation Note    Date:  11/20/2016   ID:  Sergio Ramos, DOB [birth year], MRN [id number]  PCP:  Sergio Neer, MD  Cardiologist:   Sergio Klein, MD  Reason for consultation: Strong family history of premature CAD, multiple coronary risk factors  Chief Complaint  Patient presents with  . Follow-up    pt cut his losartan from 50 mg to 25 due to low BP    History of Present Illness:  Sergio Ramos") is a 70 y.o. male with hypertension and mixed hyperlipidemia and family history of early coronary artery disease.   He exercised for more than 10 minutes on the Bruce protocol and had a hypertensive response, but his nuclear images were low risk. Losartan 50 mg was added, but he developed symptomatic hypotension with this. He has reduced the dose to 25 mg daily. He has been keeping a close log of his blood pressure and it is consistently within the desirable range. He has occasional mild orthostatic dizziness, but otherwise is tolerating the current regimen well.  He denies exertional angina or dyspnea, edema, palpitations, syncope or focal neurological complaints  His father had a heart attack at age 67 and after hospitalization, died after a brief illness at home. His brother had bypass surgery and is alive at age 71. Both his sister and his mother have had Alzheimer's dementia but has not had cardiac problems.  Sergio Ramos has never smoked. He has a history of Agent Orange exposure in [place].  He has significant mixed hyperlipidemia despite compliance with treatment with simvastatin: calculated LDL of 122 and triglycerides of 201. He does not have diabetes mellitus but did have a borderline  fasting glucose of 106.  He has had numerous urological problems. 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 He has had lifelong frequent nephrolithiasis.  In 2009 following an episode of nephrolithiasis who was incidentally found to have a small right renal cell carcinoma and had a partial nephrectomy. During routine follow-up use found to have a contralateral left renal cell carcinoma that has also been resected April 2016). 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  Past Medical History:  Diagnosis Date  . Cancer [facility])    right kidney cancer and prostate cancer   . Chronic kidney disease   . Hypertension   . Radiation    hx of for prostate cancer     Past Surgical History:  Procedure Laterality Date  . HERNIA REPAIR     umbilical hernia repair   . right nephrectomy     . ROBOTIC ASSITED PARTIAL NEPHRECTOMY Left 03/28/2015   Procedure: ROBOTIC ASSITED PARTIAL NEPHRECTOMY, INTRAOPERATIVE ULTRASOUND;  Surgeon: Sergio Frock, MD;  Location: [facility];  Service: Urology;  Laterality: Left;  . schrapnel removed from back      during [place] war     Current Medications: Outpatient Medications Prior to Visit  Medication Sig Dispense Refill  . aspirin 81 MG tablet Take 81 mg by mouth daily.    . hydrochlorothiazide (HYDRODIURIL) 25 MG tablet Take 25 mg by mouth every morning.    . simvastatin (ZOCOR) 40 MG tablet Take 40 mg by mouth daily.    Marland Kitchen losartan (COZAAR) 50 MG  tablet TAKE 1 TABLET (50 MG TOTAL) BY MOUTH DAILY. (Patient taking differently: Take 25 mg by mouth daily. ) 90 tablet 3   No facility-administered medications prior to visit.      Allergies:   Patient has no known allergies.   Social History   Social History  . Marital status: Married    Spouse name: N/A  . Number of children: N/A  . Years of education: N/A   Social History Main Topics  . Smoking status: Never Smoker  . Smokeless tobacco: Never Used  . Alcohol use 8.4 oz/week    14 Glasses  of wine per week  . Drug use: No  . Sexual activity: Not Asked   Other Topics Concern  . None   Social History Narrative  . None     Family History:  The patient's Family history significant for premature onset coronary disease in his father and his brother  ROS:   Please see the history of present illness.    ROS All other systems reviewed and are negative.   PHYSICAL EXAM:   VS:  BP 118/74 (BP Location: Right Arm, Patient Position: Sitting, Cuff Size: Normal)   Pulse 67   Ht 5\' 9"  (1.753 m)   Wt 179 lb (81.2 kg)   SpO2 99%   BMI 26.43 kg/m    GEN: Well nourished, well developed, in no acute distress  HEENT: normal  Neck: no JVD, carotid bruits, or masses Cardiac: RRR; no murmurs, rubs, or gallops,no edema  Respiratory:  clear to auscultation bilaterally, normal work of breathing GI: soft, nontender, nondistended, + BS MS: no deformity or atrophy  Skin: warm and dry, no rash Neuro:  Alert and Oriented x 3, Strength and sensation are intact Psych: euthymic mood, full affect  Wt Readings from Last 3 Encounters:  11/20/16 179 lb (81.2 kg)  09/18/16 186 lb (84.4 kg)  09/08/16 186 lb 12.8 oz (84.7 kg)      Studies/Labs Reviewed:   EKG:  EKG is ordered today.  The ekg ordered today demonstrates Normal sinus rhythm. There is T-wave inversion in lead 3 that is much more prominent than on previous ECG in 2016 and there is also a subtle new T-wave inversion in lead aVF 4 there is also mild downsloping ST segment depression. QTC normal 410 ms  Recent Labs: Creatinine 1.2, potassium 4.2, normal liver function tests except total bili 1.7, PSA 0.3  Lipid Panel Total cholesterol 227, triglycerides 201, HDL 65, calculated LDL 122  Additional studies/ records that were reviewed today include:  Notes from Sergio Ramos and Sergio Ramos     ASSESSMENT:    1. Abnormal ECG   2. Mixed hyperlipidemia   3. Essential hypertension   4. History of renal cell carcinoma   5.  History of prostate cancer      PLAN:  In order of problems listed above:  1. Abnormal ECG: Asymptomatic, low risk nuclear stress test. Unless he develops symptoms with plan on reevaluation in a few years 2. HLP: His lipid profile is not too bad, but ideally try to reach a target LDL of less than 100.  Encouraged him to take more attention to diet and to find some type of physical exercise that he can perform. This will also help with borderline hyperglycemia and overweight status. 3. HTN: Used to have optimal control on a regimen of losartan 25 mg daily plus the thiazide diuretic 4. Status post bilateral partial nephrectomy for multifocal renal cell  carcinoma and status post external beam radiation therapy for prostate cancer.  If his stress test shows normal findings, follow-up with cardiology as needed. If the stress test is abnormal will bring him in for an appointment to discuss cardiac catheterization.  Medication Adjustments/Labs and Tests Ordered: Current medicines are reviewed at length with the patient today.  Concerns regarding medicines are outlined above.  Medication changes, Labs and Tests ordered today are listed in the Patient Instructions below. Patient Instructions  Dr Sallyanne Kuster recommends that you follow-up with him as needed.    Signed, Sergio Klein, MD  11/20/2016 11:19 AM    [facility] [address], [place], [place]  [address] Phone: [phone]; Fax: [phone]

## 2016-12-08 IMAGING — MR MR ABDOMEN W/O CM
5 of 9 series · 22 of 48 positions shown · non-contrast
Comparison: CT abdomen pelvis dated 01/18/2015.

CLINICAL DATA: Evaluate liver lesion. History of renal cell
carcinoma and prostate cancer.

EXAM:
MRI ABDOMEN WITHOUT CONTRAST
TECHNIQUE: Multiplanar multisequence MR imaging was performed without the
administration of intravenous contrast.

[Series 3: T2 fat-sat · axial · 5.0mm · 0.82mm/px · z∈[-3,+242]mm · 3 of 50 slices shown]
[im 1/50]
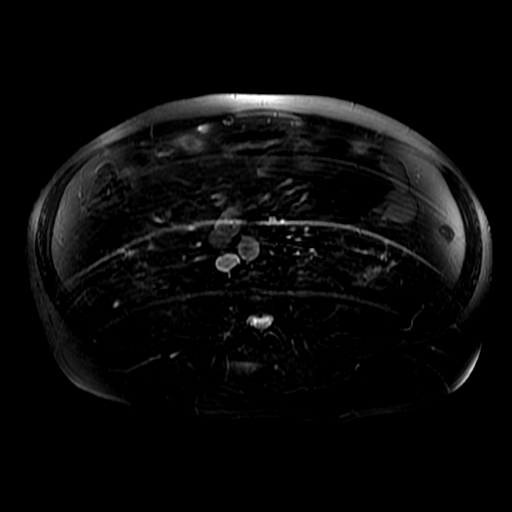
[im 25/50]
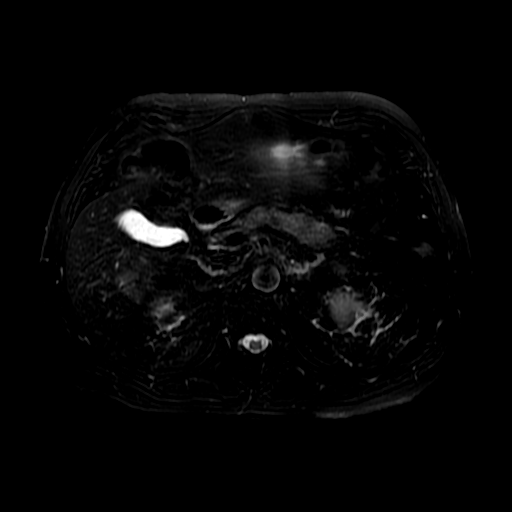
[im 50/50]
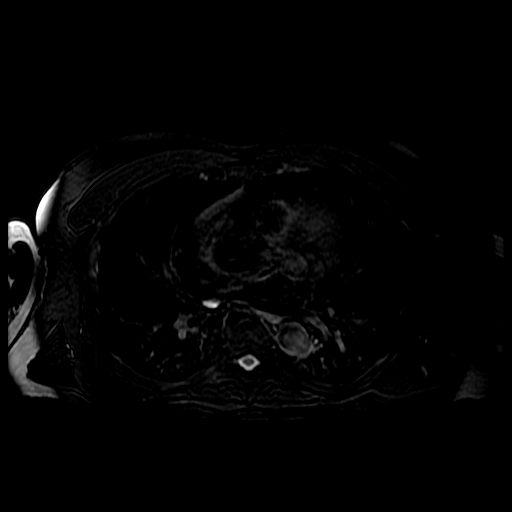

[Series 4: DWI b500 · axial · 6.0mm · 1.64mm/px · z∈[-5,+244]mm · 5 of 66 slices shown]
[im 1/66]
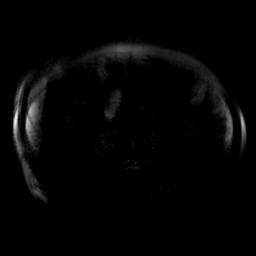
[im 17/66]
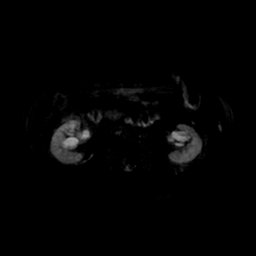
[im 33/66]
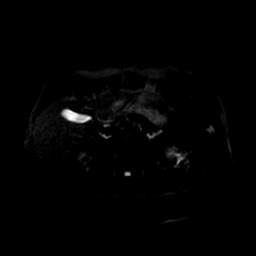
[im 49/66]
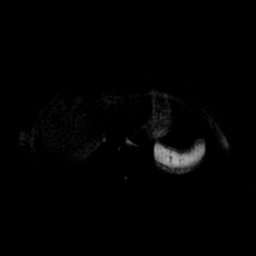
[im 66/66]
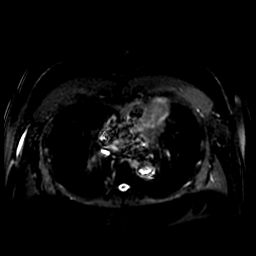

[Series 7: ax dualecho · axial · 5.0mm · 0.82mm/px · z∈[-6,+239]mm · 8 of 100 slices shown]
[im 1/100]
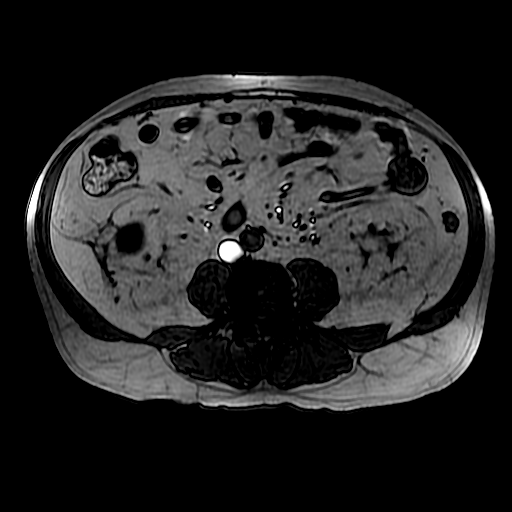
[im 15/100]
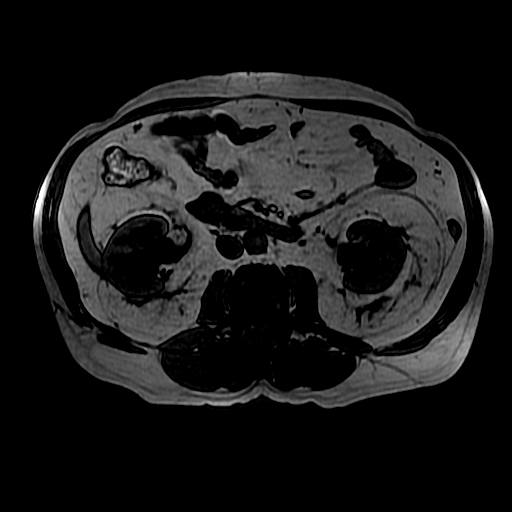
[im 29/100]
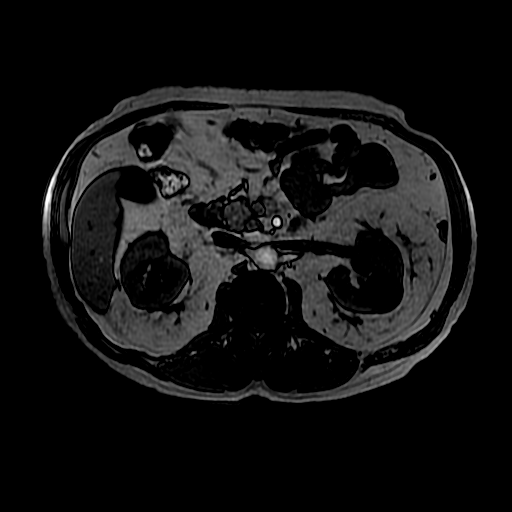
[im 43/100]
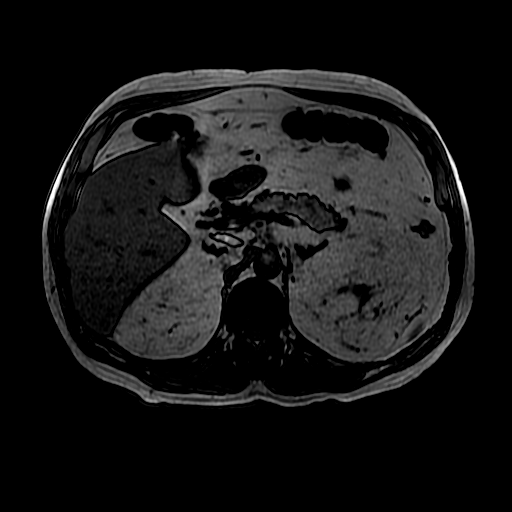
[im 57/100]
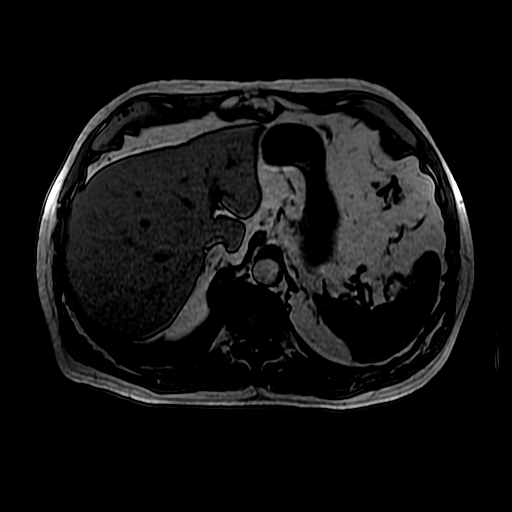
[im 71/100]
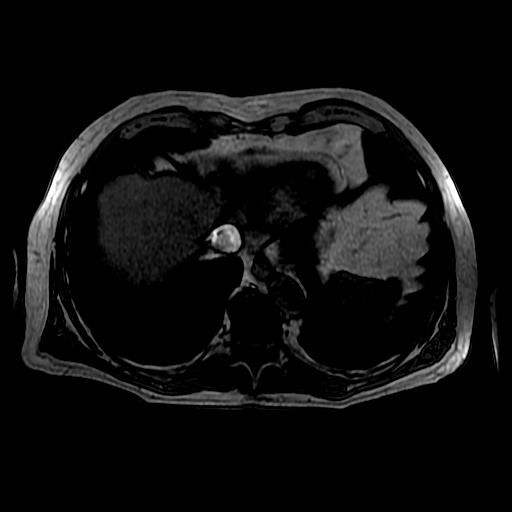
[im 85/100]
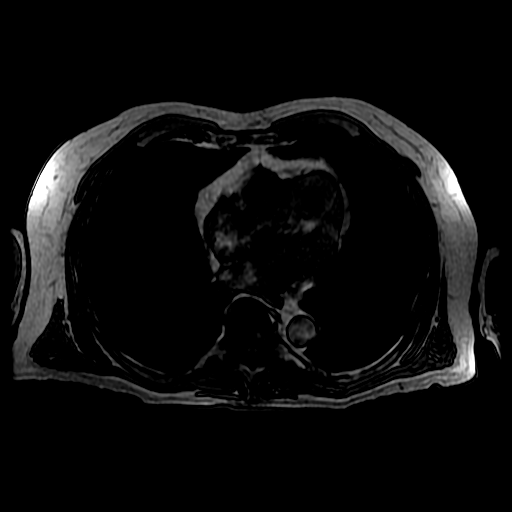
[im 100/100]
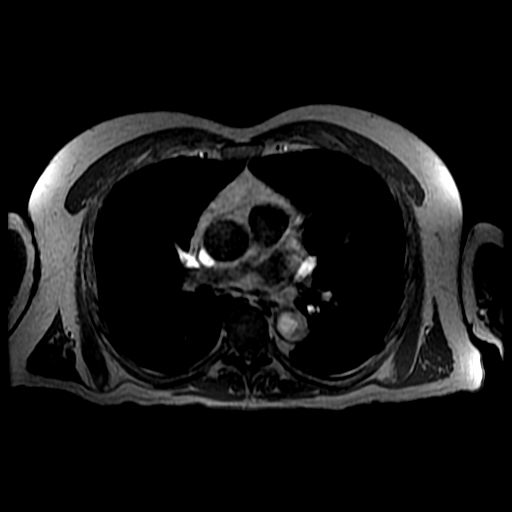

[Series 9: T2 · coronal · 5.0mm · 0.78mm/px · 4 of 47 slices shown]
[im 1/47]
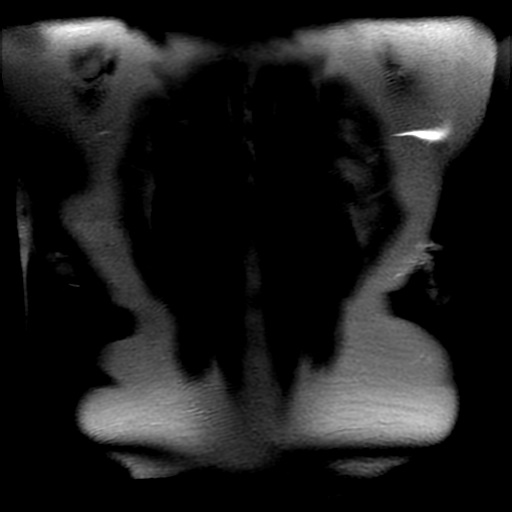
[im 16/47]
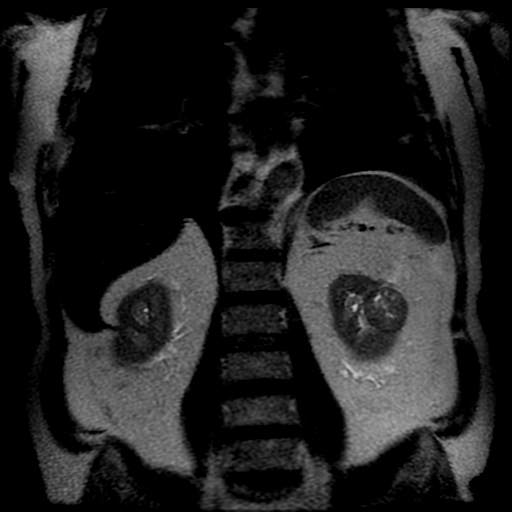
[im 31/47]
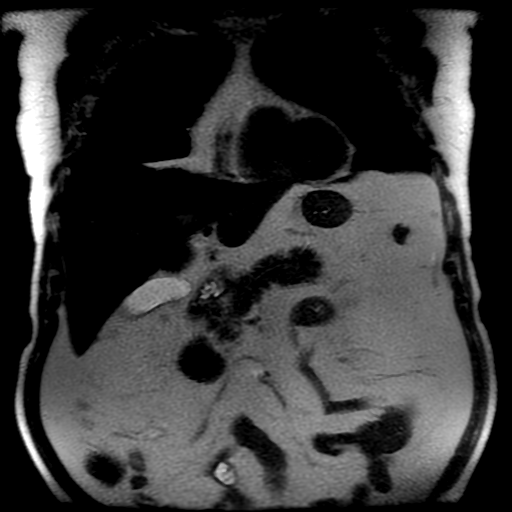
[im 47/47]
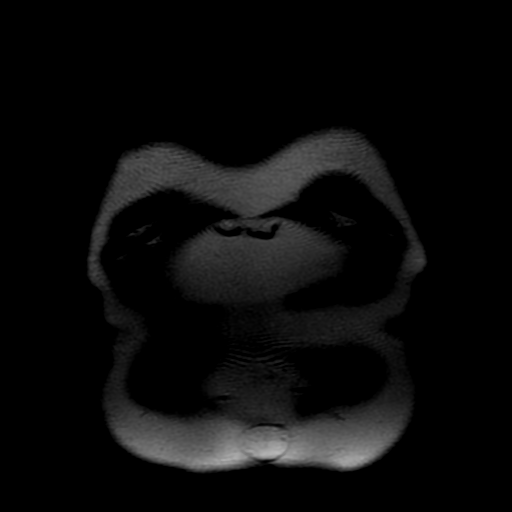

[Series 10: bSSFP · axial · 5.0mm · 0.78mm/px · z∈[-35,+40]mm · 2 of 47 slices shown]
[im 1/47]
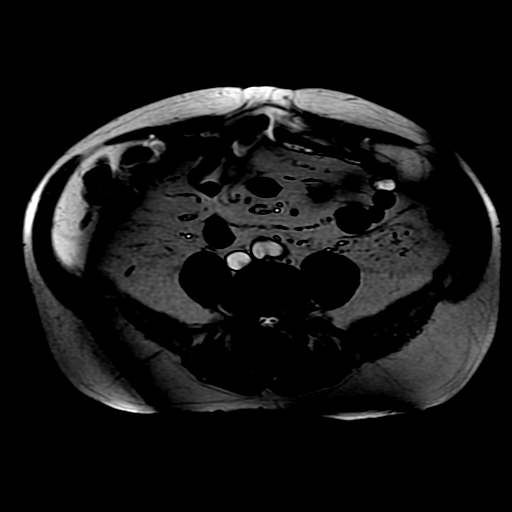
[im 16/47]
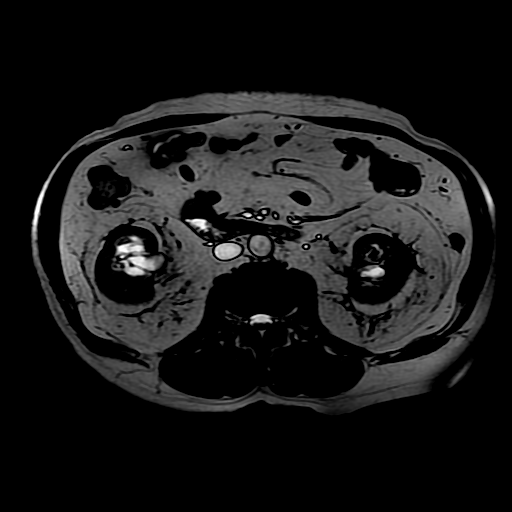

[22 of 48 positions shown; findings below may reference images not displayed]

FINDINGS: Lower chest:  Lung bases are clear.

Hepatobiliary: 13 mm T2 hyperintense lesion in the posterior segment
right hepatic lobe (series 3/ image 21). In conjunction with the
suspected peripheral nodular discontinuous enhancement on prior 6966
CT, this is compatible with a benign hemangioma.

Liver is otherwise within normal limits.  No hepatic steatosis.

Gallbladder is unremarkable. No intrahepatic or extrahepatic ductal
dilatation.

Pancreas: Within normal limits.

Spleen: Within normal limits.

Adrenals/Urinary Tract: Adrenal glands are within normal limits.

3.3 x 3.6 x 3.3 cm mass in the posterior left upper kidney (series
12/image 28), compatible with solid renal neoplasm.

Bilateral renal sinus cysts.  No hydronephrosis.

Stomach/Bowel: Stomach and visualized bowel are unremarkable.

Vascular/Lymphatic: No evidence abdominal aortic aneurysm.

Single left renal artery and vein.  No renal vein invasion.

Other: No abdominal ascites.

Musculoskeletal: No focal osseous lesions.
IMPRESSION: 13 mm T2 hyperintense lesion in the posterior segment right hepatic
lobe, compatible with a benign hemangioma when correlating with
prior 6966 CT.

3.6 cm mass in the posterior left upper kidney, compatible with
solid renal neoplasm.

## 2016-12-23 ENCOUNTER — Telehealth: Payer: Self-pay | Admitting: Cardiovascular Disease

## 2016-12-23 NOTE — Telephone Encounter (Signed)
Dr. Loletha Grayer is out of the office this week.  Does he know how low his blood pressure is going? I would suggest that he continue with the losartan 25 mg, but cut his hydrochlorothiazide in half to 12.5 mg daily.  He needs to check his BP at home once daily (each morning) and again should he note any dizziness.  Have him call back after a week with those readings.  Encourage him to drink fluids and stay hydrated as well.

## 2016-12-23 NOTE — Telephone Encounter (Signed)
Spoke with patient and he has been taking the Losartan since his is stress test in October. Stated test ok except his blood pressure elevated prior, during, and ov prior to test. He was started on Losartan 50 mg daily but started having dizziness and low blood pressure. He stopped for about a week and monitored his blood pressure. His blood pressure started to go up so he resumed Losartan 50 mg 1/2 tablet daily. Continued to monitor blood pressure and was doing much better. At follow up ov with Dr Sallyanne Kuster in December blood pressure ok and was advised to continue Losartan. He quit monitoring blood pressure at that time. Since that visit patient has started having dizziness while bending over, when he would go to bed, role over, and various times. The dizziness subsides after about a minute. Patient has not had Losartan today but has had some dizziness. Will forward to Pharm D and Dr Sallyanne Kuster for review

## 2016-12-23 NOTE — Telephone Encounter (Signed)
Advised patient of recommendations  He will start to monitor blood pressure at home daily and when symptomatic. Will  call back if systolic below [id number] if not 1 week with updated readings

## 2016-12-23 NOTE — Telephone Encounter (Signed)
New message      Pt c/o medication issue:  1. Name of Medication: losartan 2. How are you currently taking this medication (dosage and times per day)?  50mg ---1/2 tab daily 3. Are you having a reaction (difficulty breathing--STAT)? no 4. What is your medication issue?  Pt states medication is making him dizzy all of the time.  Please advise

## 2016-12-29 NOTE — Telephone Encounter (Signed)
Yes, please have him keep a daily record of BP. Low BP could cause the dizziness when he bends over, but it would be unusual for low BP to cause dizziness when he lies down or rolls over.  Can he please send 3-4 days of BP log and make sure we are all on the same page regarding his medication doses. MCr

## 2016-12-30 NOTE — Telephone Encounter (Signed)
Spoke with patient and he has been checking his blood pressure since last week  1/22  Right arm 124/78    Left arm    137/77  1/21 Right arm  128/73   Left arm     138/80                 No dates given for other readings but daily   Right arm 116/73   Left arm    134/69    Right arm 118/65   Left arm    126/72    Right arm  129/77   Left arm     134/82  Hear rate running 60's-70's  Patient does continue to be lightheaded "behind his eyes" Last eye exam couple of years ago, recommended he follow up with that Has appointment to see PCP early February

## 2016-12-31 NOTE — Telephone Encounter (Signed)
The pressure and heart rate looked great and unlikely to explain his dizziness. I agree he should check with an ophthalmologist, maybe also ENT? MCr

## 2017-01-01 NOTE — Telephone Encounter (Signed)
Advised wife, ok per DPR Patient out of town for day

## 2017-11-20 ENCOUNTER — Other Ambulatory Visit: Payer: Self-pay | Admitting: Urology

## 2017-11-20 ENCOUNTER — Ambulatory Visit (HOSPITAL_COMMUNITY)
Admission: RE | Admit: 2017-11-20 | Discharge: 2017-11-20 | Disposition: A | Discharge status: Home / Self Care | Payer: Medicare Other | Source: Ambulatory Visit | Attending: Urology | Admitting: Urology

## 2017-11-20 DIAGNOSIS — C642 Malignant neoplasm of left kidney, except renal pelvis: Secondary | ICD-10-CM

## 2018-11-18 ENCOUNTER — Other Ambulatory Visit: Payer: Self-pay | Admitting: Urology

## 2018-11-18 ENCOUNTER — Ambulatory Visit (HOSPITAL_COMMUNITY)
Admission: RE | Admit: 2018-11-18 | Discharge: 2018-11-18 | Disposition: A | Discharge status: Home / Self Care | Payer: Medicare Other | Source: Ambulatory Visit | Attending: Urology | Admitting: Urology

## 2018-11-18 DIAGNOSIS — C642 Malignant neoplasm of left kidney, except renal pelvis: Secondary | ICD-10-CM | POA: Diagnosis present

## 2019-10-18 ENCOUNTER — Other Ambulatory Visit: Payer: Self-pay

## 2019-10-18 DIAGNOSIS — Z20822 Contact with and (suspected) exposure to covid-19: Secondary | ICD-10-CM

## 2019-10-20 LAB — NOVEL CORONAVIRUS, NAA: SARS-CoV-2, NAA: NOT DETECTED

## 2019-11-25 ENCOUNTER — Other Ambulatory Visit (HOSPITAL_COMMUNITY): Payer: Self-pay | Admitting: Urology

## 2019-11-25 ENCOUNTER — Other Ambulatory Visit: Payer: Self-pay

## 2019-11-25 ENCOUNTER — Ambulatory Visit (HOSPITAL_COMMUNITY)
Admission: RE | Admit: 2019-11-25 | Discharge: 2019-11-25 | Disposition: A | Discharge status: Home / Self Care | Payer: Medicare Other | Source: Ambulatory Visit | Attending: Urology | Admitting: Urology

## 2019-11-25 DIAGNOSIS — C642 Malignant neoplasm of left kidney, except renal pelvis: Secondary | ICD-10-CM | POA: Diagnosis present

## 2019-12-21 ENCOUNTER — Ambulatory Visit: Payer: Medicare Other | Attending: Internal Medicine

## 2019-12-21 DIAGNOSIS — Z20822 Contact with and (suspected) exposure to covid-19: Secondary | ICD-10-CM

## 2019-12-22 LAB — NOVEL CORONAVIRUS, NAA: SARS-CoV-2, NAA: NOT DETECTED

## 2020-01-19 ENCOUNTER — Ambulatory Visit: Payer: Medicare Other

## 2020-10-20 ENCOUNTER — Other Ambulatory Visit: Payer: Self-pay

## 2020-10-20 ENCOUNTER — Ambulatory Visit (HOSPITAL_COMMUNITY)
Admission: EM | Admit: 2020-10-20 | Discharge: 2020-10-20 | Disposition: A | Discharge status: Home / Self Care | Payer: Medicare Other | Attending: Internal Medicine | Admitting: Internal Medicine

## 2020-10-20 DIAGNOSIS — Z1152 Encounter for screening for COVID-19: Secondary | ICD-10-CM | POA: Diagnosis not present

## 2020-10-20 DIAGNOSIS — Z20822 Contact with and (suspected) exposure to covid-19: Secondary | ICD-10-CM | POA: Diagnosis present

## 2020-10-20 LAB — SARS CORONAVIRUS 2 (TAT 6-24 HRS): SARS Coronavirus 2: NEGATIVE

## 2020-10-20 NOTE — ED Triage Notes (Signed)
Pt presents for covid testing with no known symptoms. 

## 2020-11-20 ENCOUNTER — Ambulatory Visit (HOSPITAL_COMMUNITY)
Admission: RE | Admit: 2020-11-20 | Discharge: 2020-11-20 | Disposition: A | Discharge status: Home / Self Care | Payer: Medicare Other | Source: Ambulatory Visit | Attending: Urology | Admitting: Urology

## 2020-11-20 ENCOUNTER — Other Ambulatory Visit (HOSPITAL_COMMUNITY): Payer: Self-pay | Admitting: Urology

## 2020-11-20 ENCOUNTER — Other Ambulatory Visit: Payer: Self-pay

## 2020-11-20 DIAGNOSIS — C642 Malignant neoplasm of left kidney, except renal pelvis: Secondary | ICD-10-CM | POA: Diagnosis not present

## 2021-12-11 ENCOUNTER — Other Ambulatory Visit: Payer: Self-pay | Admitting: Family Medicine

## 2021-12-11 DIAGNOSIS — M545 Low back pain, unspecified: Secondary | ICD-10-CM

## 2021-12-11 DIAGNOSIS — R200 Anesthesia of skin: Secondary | ICD-10-CM

## 2021-12-13 ENCOUNTER — Other Ambulatory Visit: Payer: Self-pay

## 2021-12-13 ENCOUNTER — Ambulatory Visit
Admission: RE | Admit: 2021-12-13 | Discharge: 2021-12-13 | Disposition: A | Discharge status: Home / Self Care | Payer: Medicare Other | Source: Ambulatory Visit | Attending: Family Medicine | Admitting: Family Medicine

## 2021-12-13 DIAGNOSIS — R200 Anesthesia of skin: Secondary | ICD-10-CM

## 2021-12-13 DIAGNOSIS — M545 Low back pain, unspecified: Secondary | ICD-10-CM

## 2021-12-13 MED ORDER — GADOBENATE DIMEGLUMINE 529 MG/ML IV SOLN
15.0000 mL | Freq: Once | INTRAVENOUS | Status: AC | PRN
  Administered 2021-12-13: 15 mL via INTRAVENOUS

## 2021-12-18 ENCOUNTER — Other Ambulatory Visit: Payer: Self-pay | Admitting: Family Medicine

## 2021-12-18 DIAGNOSIS — R42 Dizziness and giddiness: Secondary | ICD-10-CM

## 2022-01-05 ENCOUNTER — Ambulatory Visit
Admission: RE | Admit: 2022-01-05 | Discharge: 2022-01-05 | Disposition: A | Discharge status: Home / Self Care | Payer: Medicare Other | Source: Ambulatory Visit | Attending: Family Medicine | Admitting: Family Medicine

## 2022-01-05 ENCOUNTER — Other Ambulatory Visit: Payer: Self-pay

## 2022-01-05 DIAGNOSIS — R42 Dizziness and giddiness: Secondary | ICD-10-CM

## 2022-01-05 MED ORDER — GADOBENATE DIMEGLUMINE 529 MG/ML IV SOLN
15.0000 mL | Freq: Once | INTRAVENOUS | Status: AC | PRN
  Administered 2022-01-05: 15 mL via INTRAVENOUS

## 2022-01-14 ENCOUNTER — Other Ambulatory Visit: Payer: Medicare Other

## 2022-07-16 ENCOUNTER — Ambulatory Visit: Payer: Self-pay

## 2022-07-16 NOTE — Patient Outreach (Signed)
  Care Coordination   07/16/2022 Name: Sergio Ramos MRN: [id number] DOB: 12-29-1945   Care Coordination Outreach Attempts:  An unsuccessful telephone outreach was attempted today to offer the patient information about available care coordination services as a benefit of their health plan.   Follow Up Plan:  Additional outreach attempts will be made to offer the patient care coordination information and services.   Encounter Outcome:  No Answer  Care Coordination Interventions Activated:  Yes   Care Coordination Interventions:  Yes, provided     No AWV noted in EMR.  AWV completed on December 11, 2021.  Will collaborate with the quality team to assist.    [facility] [phone]

## 2022-07-28 ENCOUNTER — Ambulatory Visit: Payer: Self-pay

## 2022-07-28 NOTE — Patient Outreach (Signed)
  Care Coordination   07/28/2022 Name: Sergio Ramos MRN: [id number] DOB: 1945-12-30   Care Coordination Outreach Attempts:  A second unsuccessful outreach was attempted today to offer the patient with information about available care coordination services as a benefit of their health plan.     Follow Up Plan:  Additional outreach attempts will be made to offer the patient care coordination information and services.   Encounter Outcome:  No Answer  Care Coordination Interventions Activated:  No   Care Coordination Interventions:  No, not indicated    [facility] [phone]

## 2022-09-24 ENCOUNTER — Telehealth: Payer: Self-pay

## 2022-09-24 NOTE — Patient Outreach (Signed)
  Care Coordination   09/24/2022 Name: Sergio Ramos MRN: [id number] DOB: [birth year]   Care Coordination Outreach Attempts:  A third unsuccessful outreach was attempted today to offer the patient with information about available care coordination services as a benefit of their health plan.   Follow Up Plan:  No further outreach attempts will be made at this time. We have been unable to contact the patient to offer or enroll patient in care coordination services  Encounter Outcome:  No Answer  Care Coordination Interventions Activated:  No   Care Coordination Interventions:  No, not indicated    [facility] [phone]

## 2023-12-28 NOTE — Progress Notes (Unsigned)
Office Visit Note   Patient: Sergio Ramos           Date of Birth: 1946-11-22           MRN: [id number] Visit Date: 12/29/2023              Requested by: Lupita Raider, MD [address] [place],  [place] [address] PCP: Lupita Raider, MD   Assessment & Plan: Visit Diagnoses: No diagnosis found.  Plan: ***  Follow-Up Instructions: No follow-ups on file.   Orders:  No orders of the defined types were placed in this encounter.  No orders of the defined types were placed in this encounter.     Procedures: No procedures performed   Clinical Data: No additional findings.   Subjective: No chief complaint on file.   HPI  Review of Systems  Constitutional: Negative.   HENT: Negative.    Eyes: Negative.   Respiratory: Negative.    Cardiovascular: Negative.   Gastrointestinal: Negative.   Endocrine: Negative.   Genitourinary: Negative.   Skin: Negative.   Allergic/Immunologic: Negative.   Neurological: Negative.   Hematological: Negative.   Psychiatric/Behavioral: Negative.    All other systems reviewed and are negative.   Objective: Vital Signs: There were no vitals taken for this visit.  Physical Exam Vitals and nursing note reviewed.  Constitutional:      Appearance: He is well-developed.  HENT:     Head: Normocephalic and atraumatic.  Eyes:     Pupils: Pupils are equal, round, and reactive to light.  Pulmonary:     Effort: Pulmonary effort is normal.  Abdominal:     Palpations: Abdomen is soft.  Musculoskeletal:        General: Normal range of motion.     Cervical back: Neck supple.  Skin:    General: Skin is warm.  Neurological:     Mental Status: He is alert and oriented to person, place, and time.  Psychiatric:        Behavior: Behavior normal.        Thought Content: Thought content normal.        Judgment: Judgment normal.   Ortho Exam  Specialty Comments:  No specialty comments available.  Imaging: No  results found.   PMFS History: Patient Active Problem List   Diagnosis Date Noted  . Abnormal ECG 09/08/2016  . Mixed hyperlipidemia 09/08/2016  . Essential hypertension 09/08/2016  . History of renal cell carcinoma 09/08/2016  . History of prostate cancer 09/08/2016  . Renal mass 03/28/2015   Past Medical History:  Diagnosis Date  . Cancer [facility])    right kidney cancer and prostate cancer   . Chronic kidney disease   . Hypertension   . Radiation    hx of for prostate cancer     No family history on file.  Past Surgical History:  Procedure Laterality Date  . HERNIA REPAIR     umbilical hernia repair   . right nephrectomy     . ROBOTIC ASSITED PARTIAL NEPHRECTOMY Left 03/28/2015   Procedure: ROBOTIC ASSITED PARTIAL NEPHRECTOMY, INTRAOPERATIVE ULTRASOUND;  Surgeon: Sebastian Ache, MD;  Location: [facility];  Service: Urology;  Laterality: Left;  . schrapnel removed from back      during [place] war    Social History   Occupational History  . Not on file  Tobacco Use  . Smoking status: Never  . Smokeless tobacco: Never  Substance and Sexual Activity  . Alcohol use: Yes  Alcohol/week: 14.0 standard drinks of alcohol    Types: 14 Glasses of wine per week  . Drug use: No  . Sexual activity: Not on file

## 2023-12-29 ENCOUNTER — Other Ambulatory Visit (INDEPENDENT_AMBULATORY_CARE_PROVIDER_SITE_OTHER): Payer: Self-pay

## 2023-12-29 ENCOUNTER — Ambulatory Visit (INDEPENDENT_AMBULATORY_CARE_PROVIDER_SITE_OTHER): Payer: Medicare Other | Admitting: Orthopaedic Surgery

## 2023-12-29 DIAGNOSIS — M7021 Olecranon bursitis, right elbow: Secondary | ICD-10-CM | POA: Insufficient documentation

## 2023-12-29 MED ORDER — LIDOCAINE HCL 1 % IJ SOLN
1.0000 mL | INTRAMUSCULAR | Status: AC | PRN
  Administered 2023-12-29: 1 mL

## 2023-12-29 MED ORDER — METHYLPREDNISOLONE ACETATE 40 MG/ML IJ SUSP
40.0000 mg | INTRAMUSCULAR | Status: AC | PRN
  Administered 2023-12-29: 40 mg via INTRA_ARTICULAR

## 2023-12-29 MED ORDER — BUPIVACAINE HCL 0.5 % IJ SOLN
1.0000 mL | INTRAMUSCULAR | Status: AC | PRN
  Administered 2023-12-29: 1 mL via INTRA_ARTICULAR

## 2024-04-08 ENCOUNTER — Telehealth: Payer: Self-pay | Admitting: Orthopaedic Surgery

## 2024-04-08 NOTE — Telephone Encounter (Signed)
 Patient called and said that Dr. Christiane Cowing said if he is ready for surgery to let him know and he is ready. [phone]

## 2024-04-20 ENCOUNTER — Telehealth: Payer: Self-pay | Admitting: Orthopaedic Surgery

## 2024-04-20 NOTE — Telephone Encounter (Signed)
 Called patient to offer a surgery date right olecranon bursectomy.  Patient did not answer.  Left message on patient's voicemail, providing my name and direct number.

## 2024-06-01 ENCOUNTER — Other Ambulatory Visit: Payer: Self-pay | Admitting: Physician Assistant

## 2024-06-01 MED ORDER — ONDANSETRON HCL 4 MG PO TABS: 4.0000 mg | ORAL_TABLET | Freq: Three times a day (TID) | ORAL | 0 refills | Status: AC | PRN

## 2024-06-01 MED ORDER — HYDROCODONE-ACETAMINOPHEN 5-325 MG PO TABS: 1.0000 | ORAL_TABLET | Freq: Three times a day (TID) | ORAL | 0 refills | Status: AC | PRN

## 2024-06-02 DIAGNOSIS — M7021 Olecranon bursitis, right elbow: Secondary | ICD-10-CM | POA: Diagnosis not present

## 2024-06-09 ENCOUNTER — Ambulatory Visit: Admitting: Physician Assistant

## 2024-06-09 DIAGNOSIS — M7021 Olecranon bursitis, right elbow: Secondary | ICD-10-CM

## 2024-06-09 NOTE — Progress Notes (Signed)
   Post-Op Visit Note   Patient: Sergio Ramos           Date of Birth: 1946/03/26           MRN: [id number] Visit Date: 06/09/2024 PCP: Loreli Kins, MD   Assessment & Plan:  Chief Complaint:  Chief Complaint  Patient presents with   Right Elbow - Follow-up    Right elbow bursectomy 06/02/2024   Visit Diagnoses:  1. Olecranon bursitis, right elbow     Plan: Patient is a pleasant 78 year old gentleman who comes in today 1 week status post right elbow olecranon bursectomy 06/02/2024.  He has been doing well.  He has been compliant wearing his long-arm splint.  He does have some discomfort but is not taking anything for pain.  Examination of his elbow out of the splint reveals a well-healing surgical incision with nylon sutures in place.  He does have fullness to the olecranon bursa.  Fingers are warm well-perfused.  He is neurovascularly intact distally.  Today, his wound was cleaned and recovered.  Compression wrap applied.  He may use his arm as tolerated but avoid any activities that stressed the incision.  Avoid any pressure to the incision as well.  Follow-up next week for suture removal.  Call with any concerns or questions in the meantime.  Follow-Up Instructions: Return in about 1 week (around 06/16/2024).   Orders:  No orders of the defined types were placed in this encounter.  No orders of the defined types were placed in this encounter.   Imaging: No new imaging  PMFS History: Patient Active Problem List   Diagnosis Date Noted   Olecranon bursitis, right elbow 12/29/2023   Abnormal ECG 09/08/2016   Mixed hyperlipidemia 09/08/2016   Essential hypertension 09/08/2016   History of renal cell carcinoma 09/08/2016   History of prostate cancer 09/08/2016   Renal mass 03/28/2015   Past Medical History:  Diagnosis Date   Cancer [facility])    right kidney cancer and prostate cancer    Chronic kidney disease    Hypertension    Radiation    hx of for prostate cancer      No family history on file.  Past Surgical History:  Procedure Laterality Date   HERNIA REPAIR     umbilical hernia repair    right nephrectomy      ROBOTIC ASSITED PARTIAL NEPHRECTOMY Left 03/28/2015   Procedure: ROBOTIC ASSITED PARTIAL NEPHRECTOMY, INTRAOPERATIVE ULTRASOUND;  Surgeon: Ricardo Likens, MD;  Location: [facility];  Service: Urology;  Laterality: Left;   schrapnel removed from back      during [place] war    Social History   Occupational History   Not on file  Tobacco Use   Smoking status: Never   Smokeless tobacco: Never  Substance and Sexual Activity   Alcohol use: Yes    Alcohol/week: 14.0 standard drinks of alcohol    Types: 14 Glasses of wine per week   Drug use: No   Sexual activity: Not on file

## 2024-06-15 ENCOUNTER — Encounter: Admitting: Orthopaedic Surgery

## 2024-06-17 ENCOUNTER — Ambulatory Visit (INDEPENDENT_AMBULATORY_CARE_PROVIDER_SITE_OTHER): Admitting: Orthopaedic Surgery

## 2024-06-17 DIAGNOSIS — M7021 Olecranon bursitis, right elbow: Secondary | ICD-10-CM

## 2024-06-17 NOTE — Progress Notes (Signed)
   Post-Op Visit Note   Patient: Sergio Ramos           Date of Birth: [birth year]           MRN: [id number]  Visit Date: 06/17/2024 PCP: Loreli Kins, MD   Assessment & Plan:  Chief Complaint:  Chief Complaint  Patient presents with   Right Elbow - Pain    DOS: 06/02/2024-Right Elbow Bursectomy   Visit Diagnoses:  1. Olecranon bursitis, right elbow     Plan: History of Present Illness The patient presents with postoperative swelling and soreness in the elbow following recent surgery.  He experiences significant swelling in the elbow, especially below the incision site, with the hand swelling significantly upon waking. He uses a large bandage for protection. The swelling is uncomfortable, and the area is very sore, with soreness described as 'like the bone.'  Physical Exam MUSCULOSKELETAL: Elbow normal post-surgery appearance, no concerning findings.  Assessment and Plan 2 weeks s/p olecranon bursectomy Postoperative swelling and soreness Swelling and soreness due to normal postoperative course and recent surgery.  - Remove sutures and apply strips to incision. - Advise showering and strip removal in one week. - Instruct to return if concerns or no improvement.  Follow-Up Instructions: Return if symptoms worsen or fail to improve.   Orders:  No orders of the defined types were placed in this encounter.  No orders of the defined types were placed in this encounter.   Imaging: No results found.  PMFS History: Patient Active Problem List   Diagnosis Date Noted   Olecranon bursitis, right elbow 12/29/2023   Abnormal ECG 09/08/2016   Mixed hyperlipidemia 09/08/2016   Essential hypertension 09/08/2016   History of renal cell carcinoma 09/08/2016   History of prostate cancer 09/08/2016   Renal mass 03/28/2015   Past Medical History:  Diagnosis Date   Cancer [facility])    right kidney cancer and prostate cancer    Chronic kidney disease    Hypertension     Radiation    hx of for prostate cancer     No family history on file.  Past Surgical History:  Procedure Laterality Date   HERNIA REPAIR     umbilical hernia repair    right nephrectomy      ROBOTIC ASSITED PARTIAL NEPHRECTOMY Left 03/28/2015   Procedure: ROBOTIC ASSITED PARTIAL NEPHRECTOMY, INTRAOPERATIVE ULTRASOUND;  Surgeon: Ricardo Likens, MD;  Location: [facility];  Service: Urology;  Laterality: Left;   schrapnel removed from back      during [place] war    Social History   Occupational History   Not on file  Tobacco Use   Smoking status: Never   Smokeless tobacco: Never  Substance and Sexual Activity   Alcohol use: Yes    Alcohol/week: 14.0 standard drinks of alcohol    Types: 14 Glasses of wine per week   Drug use: No   Sexual activity: Not on file

## 2024-06-24 ENCOUNTER — Encounter: Payer: Self-pay | Admitting: Advanced Practice Midwife

## 2024-10-10 ENCOUNTER — Encounter: Payer: Self-pay | Admitting: Radiology
